# Patient Record
Sex: Female | Born: 1965 | Race: White | Hispanic: Yes | Marital: Married | Smoking: Never smoker
Health system: Southern US, Community
[De-identification: ages and names within clinical notes are randomized; demographics above are authoritative.]

## PROBLEM LIST (undated history)

## (undated) DIAGNOSIS — E785 Hyperlipidemia, unspecified: Secondary | ICD-10-CM

---

## 2006-11-15 ENCOUNTER — Encounter: Admission: RE | Admit: 2006-11-15 | Discharge: 2006-11-15 | Payer: Self-pay | Admitting: Sports Medicine

## 2006-11-15 IMAGING — MG MM SCREEN MAMMOGRAM BILATERAL
5 series · 5 of 5 positions shown · non-contrast
Comparison: none

DG SCREEN MAMMOGRAM BILATERAL
Bilateral CC and MLO view(s) were taken.

DIGITAL SCREENING MAMMOGRAM WITH CAD:
There is a  dense fibroglandular pattern.  No masses or malignant type calcifications are 
identified.

[R CC (1 of 2)]
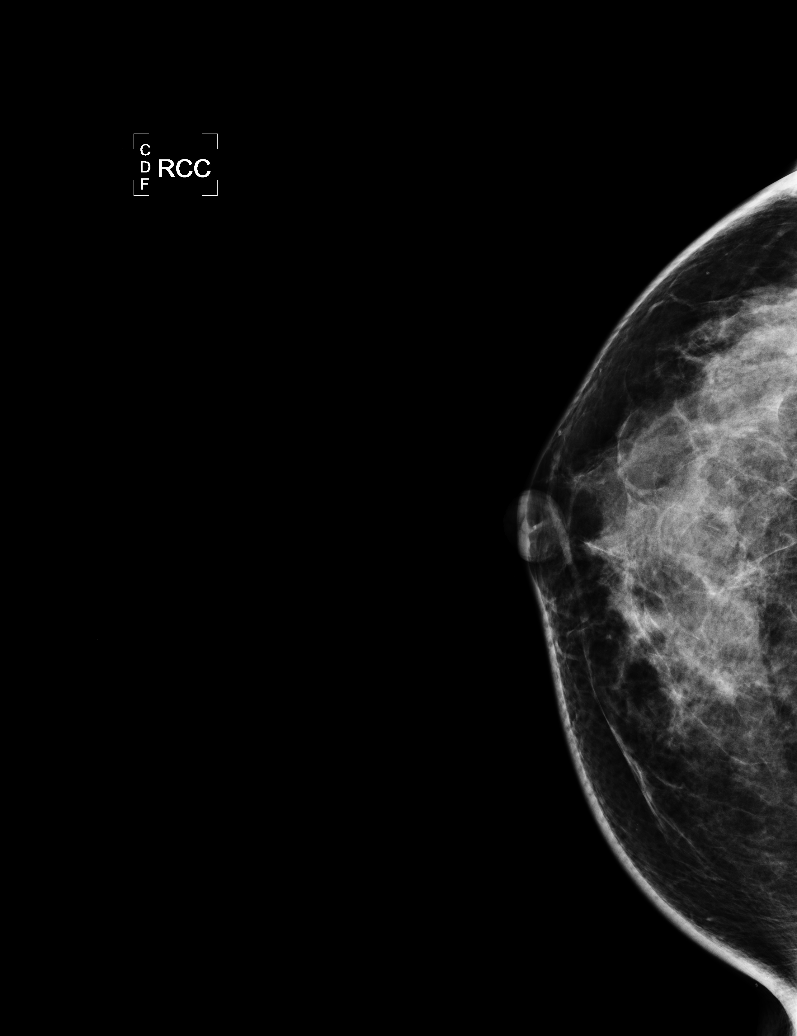

[L CC]
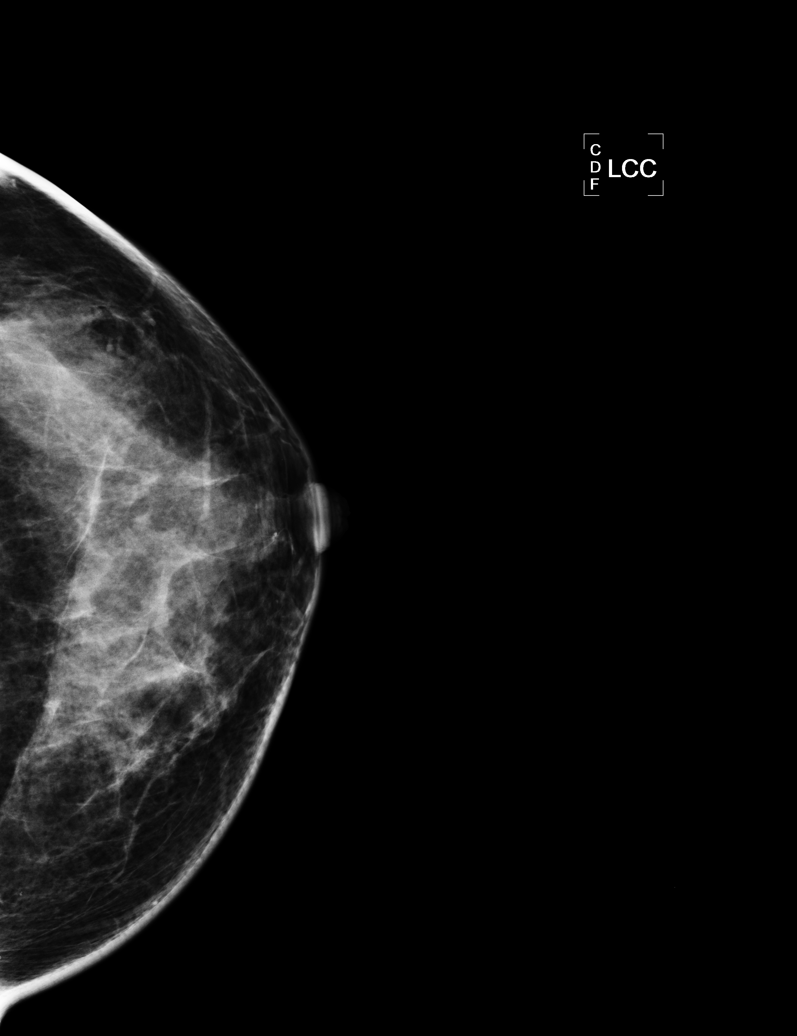

[L MLO]
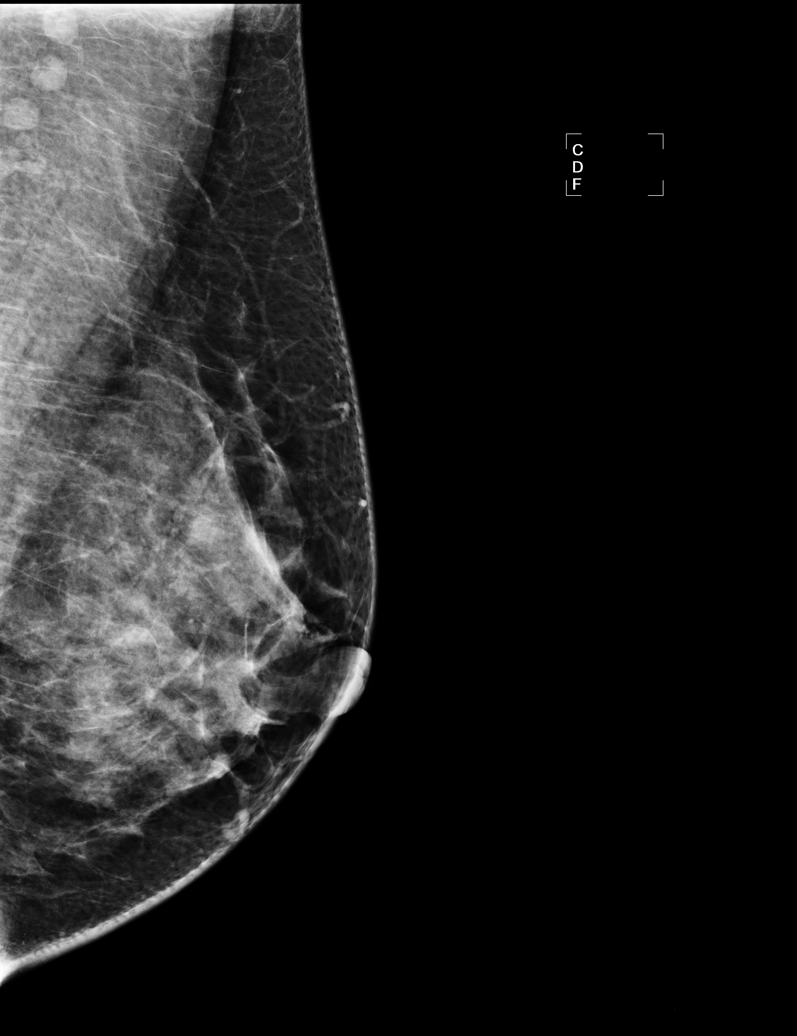

[R MLO]
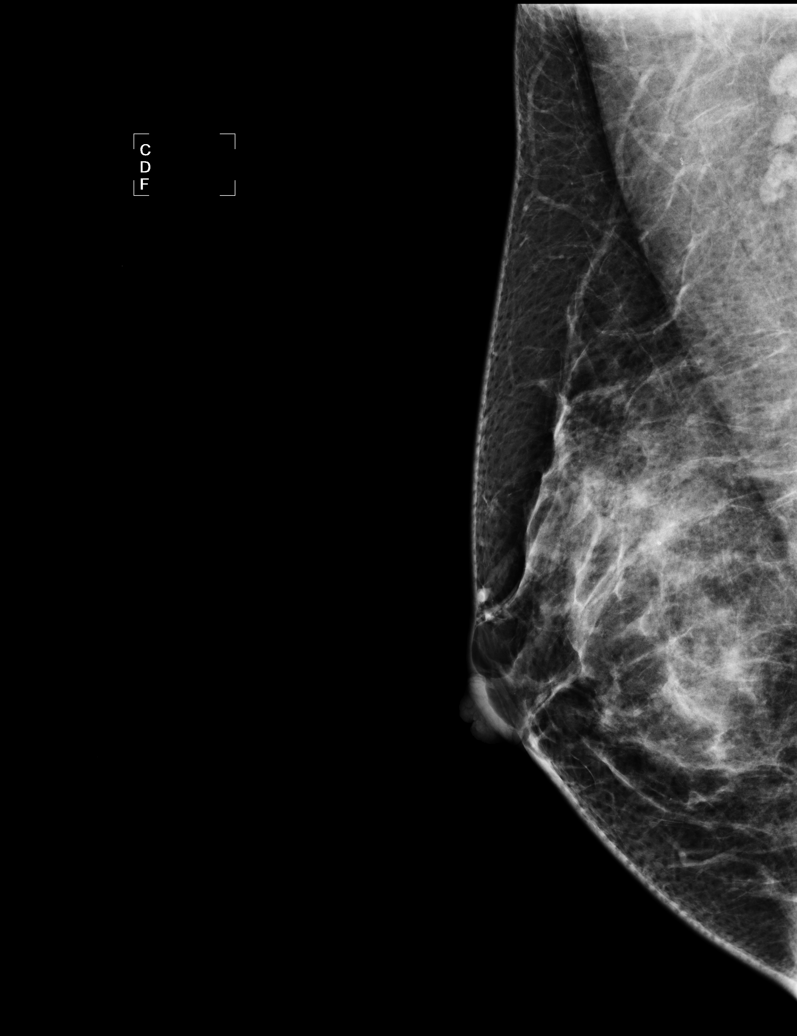

[R CC (2 of 2)]
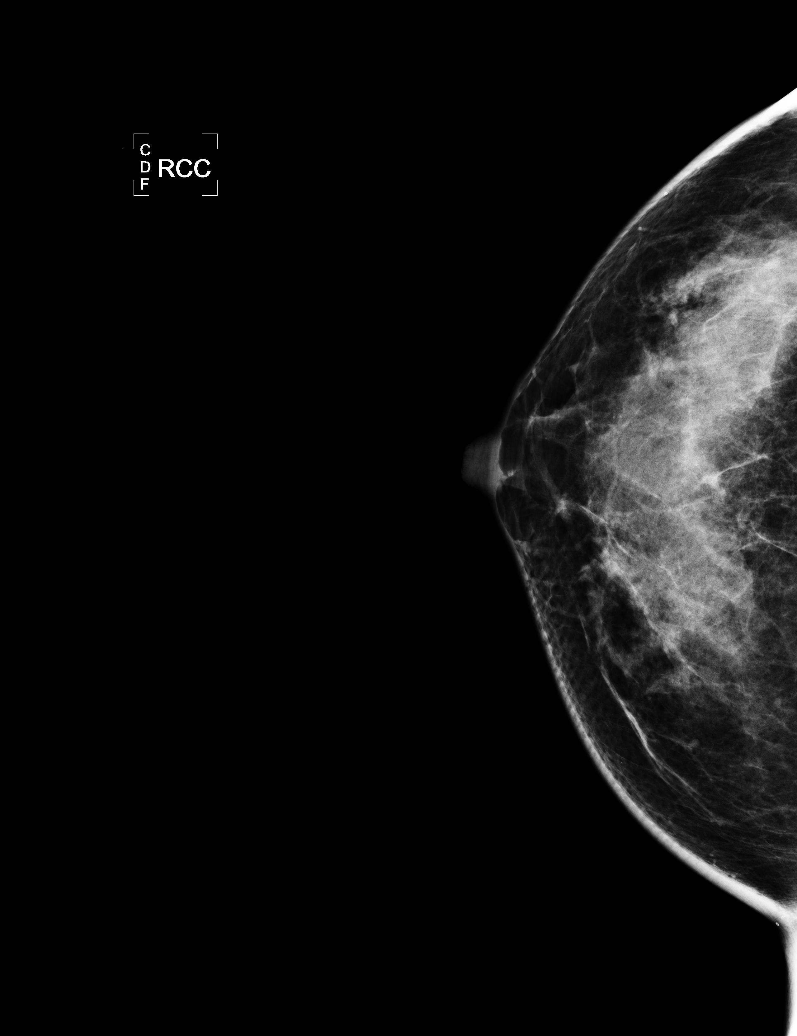

[5 of 5 positions shown; findings below may reference images not displayed]

IMPRESSION: No specific mammographic evidence of malignancy.  Next screening mammogram is recommended in one 
year.

ASSESSMENT: Negative - BI-RADS 1

Screening mammogram in 1 year.
ANALYZED BY COMPUTER AIDED DETECTION. , THIS PROCEDURE WAS A DIGITAL MAMMOGRAM.

## 2009-11-25 ENCOUNTER — Encounter: Admission: RE | Admit: 2009-11-25 | Discharge: 2009-11-25 | Payer: Self-pay | Admitting: Family Medicine

## 2013-10-18 ENCOUNTER — Encounter (HOSPITAL_COMMUNITY): Payer: Self-pay | Admitting: Pharmacist

## 2013-10-24 NOTE — H&P (Addendum)
48 yo with irregular VB and endometrial mass presents for surgical mngt  PMHx:  Neg PSHx:  SVD x 2, c-section x 1 All:  None Meds:  MTV SHx:  Neg tobacco  Af, vss Gen - NAD ABd - soft, NT CV - RRR Lungs - clear Ext - NT PV - uterus mobile, NT.  No adnexal mass  US:  2.1cm intracavitary mass noted after saline infusion.  Intramural fibroids No adnexal mass  A/P:  Irregular vb and endometrial mass - suspect polyp or fibroid Hysteroscopy, D&C, resection of mass R/b/a discussed, questions answered, informed consent

## 2013-10-27 MED ORDER — CEFOTETAN DISODIUM 2 G IJ SOLR
2.0000 g | INTRAMUSCULAR | Status: AC
  Administered 2013-10-28: 2 g via INTRAVENOUS
  Filled 2013-10-27: qty 2

## 2013-10-28 ENCOUNTER — Ambulatory Visit (HOSPITAL_COMMUNITY)
Admission: RE | Admit: 2013-10-28 | Discharge: 2013-10-28 | Disposition: A | Discharge status: Home / Self Care | Payer: BC Managed Care – PPO | Source: Ambulatory Visit | Attending: Obstetrics and Gynecology | Admitting: Obstetrics and Gynecology

## 2013-10-28 ENCOUNTER — Encounter (HOSPITAL_COMMUNITY): Payer: BC Managed Care – PPO | Admitting: Anesthesiology

## 2013-10-28 ENCOUNTER — Ambulatory Visit (HOSPITAL_COMMUNITY): Payer: BC Managed Care – PPO | Admitting: Anesthesiology

## 2013-10-28 ENCOUNTER — Encounter (HOSPITAL_COMMUNITY)
Admission: RE | Disposition: A | Discharge status: Home / Self Care | Payer: Self-pay | Source: Ambulatory Visit | Attending: Obstetrics and Gynecology

## 2013-10-28 DIAGNOSIS — N840 Polyp of corpus uteri: Secondary | ICD-10-CM | POA: Insufficient documentation

## 2013-10-28 HISTORY — PX: DILATATION & CURETTAGE/HYSTEROSCOPY WITH TRUECLEAR: SHX6353

## 2013-10-28 LAB — CBC
HCT: 34.2 % — ABNORMAL LOW (ref 36.0–46.0)
Hemoglobin: 11.3 g/dL — ABNORMAL LOW (ref 12.0–15.0)
MCH: 28.1 pg (ref 26.0–34.0)
MCHC: 33 g/dL (ref 30.0–36.0)
MCV: 85.1 fL (ref 78.0–100.0)
PLATELETS: 294 10*3/uL (ref 150–400)
RBC: 4.02 MIL/uL (ref 3.87–5.11)
RDW: 13.9 % (ref 11.5–15.5)
WBC: 6.6 10*3/uL (ref 4.0–10.5)

## 2013-10-28 LAB — PREGNANCY, URINE: Preg Test, Ur: NEGATIVE

## 2013-10-28 SURGERY — DILATATION & CURETTAGE/HYSTEROSCOPY WITH TRUCLEAR
Anesthesia: General | Site: Uterus

## 2013-10-28 MED ORDER — DEXAMETHASONE SODIUM PHOSPHATE 4 MG/ML IJ SOLN
INTRAMUSCULAR | Status: DC | PRN
  Administered 2013-10-28: 10 mg via INTRAVENOUS

## 2013-10-28 MED ORDER — DEXAMETHASONE SODIUM PHOSPHATE 10 MG/ML IJ SOLN
INTRAMUSCULAR | Status: AC
  Filled 2013-10-28: qty 1

## 2013-10-28 MED ORDER — KETOROLAC TROMETHAMINE 30 MG/ML IJ SOLN
INTRAMUSCULAR | Status: DC | PRN
  Administered 2013-10-28: 30 mg via INTRAVENOUS

## 2013-10-28 MED ORDER — HYDROCODONE-IBUPROFEN 7.5-200 MG PO TABS: 1.0000 | ORAL_TABLET | Freq: Three times a day (TID) | ORAL | Status: DC | PRN

## 2013-10-28 MED ORDER — LIDOCAINE HCL (CARDIAC) 20 MG/ML IV SOLN
INTRAVENOUS | Status: AC
  Filled 2013-10-28: qty 5

## 2013-10-28 MED ORDER — ONDANSETRON HCL 4 MG/2ML IJ SOLN
INTRAMUSCULAR | Status: DC | PRN
  Administered 2013-10-28: 4 mg via INTRAVENOUS

## 2013-10-28 MED ORDER — MIDAZOLAM HCL 2 MG/2ML IJ SOLN: 0.5000 mg | Freq: Once | INTRAMUSCULAR | Status: DC | PRN

## 2013-10-28 MED ORDER — LIDOCAINE HCL (CARDIAC) 20 MG/ML IV SOLN
INTRAVENOUS | Status: DC | PRN
  Administered 2013-10-28: 80 mg via INTRAVENOUS

## 2013-10-28 MED ORDER — MIDAZOLAM HCL 2 MG/2ML IJ SOLN
INTRAMUSCULAR | Status: AC
  Filled 2013-10-28: qty 2

## 2013-10-28 MED ORDER — MEPERIDINE HCL 25 MG/ML IJ SOLN: 6.2500 mg | INTRAMUSCULAR | Status: DC | PRN

## 2013-10-28 MED ORDER — LACTATED RINGERS IV SOLN
INTRAVENOUS | Status: DC
  Administered 2013-10-28 (×2): via INTRAVENOUS

## 2013-10-28 MED ORDER — LIDOCAINE HCL 1 % IJ SOLN
INTRAMUSCULAR | Status: AC
  Filled 2013-10-28: qty 20

## 2013-10-28 MED ORDER — MIDAZOLAM HCL 2 MG/2ML IJ SOLN
INTRAMUSCULAR | Status: DC | PRN
  Administered 2013-10-28: 2 mg via INTRAVENOUS

## 2013-10-28 MED ORDER — EPHEDRINE 5 MG/ML INJ
INTRAVENOUS | Status: AC
  Filled 2013-10-28: qty 10

## 2013-10-28 MED ORDER — PROPOFOL 10 MG/ML IV EMUL
INTRAVENOUS | Status: AC
  Filled 2013-10-28: qty 20

## 2013-10-28 MED ORDER — ONDANSETRON HCL 4 MG/2ML IJ SOLN
INTRAMUSCULAR | Status: AC
  Filled 2013-10-28: qty 2

## 2013-10-28 MED ORDER — EPHEDRINE SULFATE 50 MG/ML IJ SOLN
INTRAMUSCULAR | Status: DC | PRN
  Administered 2013-10-28 (×2): 5 mg via INTRAVENOUS

## 2013-10-28 MED ORDER — KETOROLAC TROMETHAMINE 30 MG/ML IJ SOLN: 15.0000 mg | Freq: Once | INTRAMUSCULAR | Status: DC | PRN

## 2013-10-28 MED ORDER — PROMETHAZINE HCL 25 MG/ML IJ SOLN: 6.2500 mg | INTRAMUSCULAR | Status: DC | PRN

## 2013-10-28 MED ORDER — FENTANYL CITRATE 0.05 MG/ML IJ SOLN
INTRAMUSCULAR | Status: DC | PRN
  Administered 2013-10-28: 100 ug via INTRAVENOUS

## 2013-10-28 MED ORDER — PROPOFOL INFUSION 10 MG/ML OPTIME
INTRAVENOUS | Status: DC | PRN
  Administered 2013-10-28: 150 mL via INTRAVENOUS

## 2013-10-28 MED ORDER — FENTANYL CITRATE 0.05 MG/ML IJ SOLN
INTRAMUSCULAR | Status: AC
  Filled 2013-10-28: qty 5

## 2013-10-28 MED ORDER — LIDOCAINE HCL 1 % IJ SOLN
INTRAMUSCULAR | Status: DC | PRN
  Administered 2013-10-28: 20 mL

## 2013-10-28 MED ORDER — FENTANYL CITRATE 0.05 MG/ML IJ SOLN: 25.0000 ug | INTRAMUSCULAR | Status: DC | PRN

## 2013-10-28 MED ORDER — SODIUM CHLORIDE 0.9 % IR SOLN
Status: DC | PRN
  Administered 2013-10-28: 3000 mL

## 2013-10-28 SURGICAL SUPPLY — 23 items
BLADE INCISOR TRUC PLUS 2.9 (ABLATOR) IMPLANT
CANISTERS HI-FLOW 3000CC (CANNISTER) ×2 IMPLANT
CATH ROBINSON RED A/P 16FR (CATHETERS) ×3 IMPLANT
CLOTH BEACON ORANGE TIMEOUT ST (SAFETY) ×3 IMPLANT
CONTAINER PREFILL 10% NBF 60ML (FORM) ×6 IMPLANT
DRAPE HYSTEROSCOPY (DRAPE) ×3 IMPLANT
DRSG TELFA 3X8 NADH (GAUZE/BANDAGES/DRESSINGS) ×3 IMPLANT
GLOVE BIO SURGEON STRL SZ 6.5 (GLOVE) ×2 IMPLANT
GLOVE BIO SURGEONS STRL SZ 6.5 (GLOVE) ×1
GLOVE BIOGEL PI IND STRL 7.0 (GLOVE) ×1 IMPLANT
GLOVE BIOGEL PI INDICATOR 7.0 (GLOVE) ×2
GOWN STRL REUS W/TWL LRG LVL3 (GOWN DISPOSABLE) ×6 IMPLANT
INCISOR TRUC PLUS BLADE 2.9 (ABLATOR) ×3
KIT HYSTEROSCOPY TRUCLEAR (ABLATOR) ×2 IMPLANT
MORCELLATOR RECIP TRUCLEAR 4.0 (ABLATOR) IMPLANT
NDL SPNL 22GX3.5 QUINCKE BK (NEEDLE) ×1 IMPLANT
NEEDLE SPNL 22GX3.5 QUINCKE BK (NEEDLE) ×3 IMPLANT
PACK VAGINAL MINOR WOMEN LF (CUSTOM PROCEDURE TRAY) ×3 IMPLANT
PAD DRESSING TELFA 3X8 NADH (GAUZE/BANDAGES/DRESSINGS) ×1 IMPLANT
PAD OB MATERNITY 4.3X12.25 (PERSONAL CARE ITEMS) ×3 IMPLANT
SYR CONTROL 10ML LL (SYRINGE) ×3 IMPLANT
TOWEL OR 17X24 6PK STRL BLUE (TOWEL DISPOSABLE) ×6 IMPLANT
WATER STERILE IRR 1000ML POUR (IV SOLUTION) ×3 IMPLANT

## 2013-10-28 NOTE — Anesthesia Postprocedure Evaluation (Signed)
  Anesthesia Post-op Note  Anesthesia Post Note  Patient: Kristen Wells  Procedure(s) Performed: Procedure(s) (LRB): DILATATION & CURETTAGE/HYSTEROSCOPY WITH TRUCLEAR (N/A)  Anesthesia type: General  Patient location: PACU  Post pain: Pain level controlled  Post assessment: Post-op Vital signs reviewed  Last Vitals:  Filed Vitals:   10/28/13 1400  BP: 109/68  Pulse: 60  Temp:   Resp: 14    Post vital signs: Reviewed  Level of consciousness: sedated  Complications: No apparent anesthesia complications

## 2013-10-28 NOTE — Anesthesia Procedure Notes (Signed)
Procedure Name: LMA Insertion Date/Time: 10/28/2013 1:18 PM Performed by: Graciela HusbandsFUSSELL, Darlyn Repsher O Pre-anesthesia Checklist: Suction available, Emergency Drugs available, Timeout performed, Patient identified and Patient being monitored Patient Re-evaluated:Patient Re-evaluated prior to inductionOxygen Delivery Method: Circle system utilized Preoxygenation: Pre-oxygenation with 100% oxygen Intubation Type: IV induction LMA: LMA inserted LMA Size: 4.0 Number of attempts: 1 Placement Confirmation: breath sounds checked- equal and bilateral and positive ETCO2 Tube secured with: Tape Dental Injury: Teeth and Oropharynx as per pre-operative assessment

## 2013-10-28 NOTE — Discharge Instructions (Signed)
[facility] office 2-3 weeks for postop appointment.  Call the office [phone] for an appointment. ° °Personal Hygiene: °Use pads not tampons x 1week °You may shower, no tub baths or pools for 2-3 weeks °Wipe from front to back when using restroom ° °Activity: °Do not drive or operate any equipment for 24 hrs.   °Do not rest in bed all day °Walking is encouraged °Walk up and down stairs slowly °You may return to your normal activity in 1-2 days ° °Sexual Activity:  No intercourse for 2 weeks after the procedure. ° °Diet: Eat a light meal as desired this evening.  You may resume your usual diet tomorrow. ° °Return to work:  You may resume your work activities after 1-2 days ° °What to expect:  Expect to have vaginal bleeding/discharge for 2-3 days and spotting for 10-14 days.  It is not unusual to have soreness for 1-2 weeks.  You may have a slight burning sensation when you urinate for the first few days.  You may start your menses in 2-6 weeks.  Mild cramps may continue for a couple of days.   ° °Call your doctor:   °Excessive bleeding, saturating a pad every hour °Inability to urinate 6 hours after discharge °Pain not relieved with pain medications °Fever of 100.4 or greater ° ° °Post Anesthesia Home Care Instructions ° °Activity: °Get plenty of rest for the remainder of the day. A responsible adult should stay with you for 24 hours following the procedure.  °For the next 24 hours, DO NOT: °-Drive a car °-Operate machinery °-Drink alcoholic beverages °-Take any medication unless instructed by your physician °-Make any legal decisions or sign important papers. ° °Meals: °Start with liquid foods such as gelatin or soup. Progress to regular foods as tolerated. Avoid greasy, spicy, heavy foods. If nausea and/or vomiting occur, drink only clear liquids until the nausea and/or vomiting subsides. Call your physician if vomiting continues. ° °Special Instructions/Symptoms: °Your throat may feel dry or sore from the anesthesia or  the breathing tube placed in your throat during surgery. If this causes discomfort, gargle with warm salt water. The discomfort should disappear within 24 hours. ° °

## 2013-10-28 NOTE — Anesthesia Preprocedure Evaluation (Addendum)
Anesthesia Evaluation  Patient identified by MRN, date of birth, ID band Patient awake    Reviewed: Allergy & Precautions, H&P , Patient's Chart, lab work & pertinent test results  Airway Mallampati: II      Dental   Pulmonary  breath sounds clear to auscultation        Cardiovascular Exercise Tolerance: Good Rhythm:regular Rate:Normal     Neuro/Psych negative psych ROS   GI/Hepatic   Endo/Other    Renal/GU      Musculoskeletal   Abdominal   Peds  Hematology   Anesthesia Other Findings   Reproductive/Obstetrics                           Anesthesia Physical Anesthesia Plan  ASA: I  Anesthesia Plan: General LMA   Post-op Pain Management:    Induction:   Airway Management Planned:   Additional Equipment:   Intra-op Plan:   Post-operative Plan:   Informed Consent: I have reviewed the patients History and Physical, chart, labs and discussed the procedure including the risks, benefits and alternatives for the proposed anesthesia with the patient or authorized representative who has indicated his/her understanding and acceptance.     Plan Discussed with: Anesthesiologist, CRNA and Surgeon  Anesthesia Plan Comments:        Anesthesia Quick Evaluation

## 2013-10-28 NOTE — Transfer of Care (Signed)
Immediate Anesthesia Transfer of Care Note  Patient: Kristen Wells  Procedure(s) Performed: Procedure(s): DILATATION & CURETTAGE/HYSTEROSCOPY WITH TRUCLEAR (N/A)  Patient Location: PACU  Anesthesia Type:General  Level of Consciousness: awake, alert  and oriented  Airway & Oxygen Therapy: Patient Spontanous Breathing and Patient connected to nasal cannula oxygen  Post-op Assessment: Report given to PACU RN and Post -op Vital signs reviewed and stable  Post vital signs: Reviewed and stable  Complications: No apparent anesthesia complications

## 2013-10-29 ENCOUNTER — Encounter (HOSPITAL_COMMUNITY): Payer: Self-pay | Admitting: Obstetrics and Gynecology

## 2013-10-29 NOTE — Op Note (Signed)
NAMDanise Mina:  Wells, Kristen Wells                  ACCOUNT NO.:  [id number]  MEDICAL RECORD NO.:  [id number]  LOCATION:  [facility]                          [facility]ACILITY:  [facility]  PHYSICIAN:  Zelphia CairoGretchen Yenty Bloch, MD    DATE OF BIRTH:  [birth year]  DATE OF PROCEDURE:  10/28/2013 DATE OF DISCHARGE:  10/28/2013                              OPERATIVE REPORT   PREOPERATIVE DIAGNOSES: 1. Endometrial polyp. 2. Endometrial mass.  POSTOPERATIVE DIAGNOSES: 1. Endometrial polyp. 2. Endometrial mass, path pending.  PROCEDURE: 1. Cervical block. 2. Hysteroscopy. 3. D and C. 4. Polypectomy.  SURGEON:  Zelphia CairoGretchen Lavel Rieman, MD  ANESTHESIA:  General.  SPECIMEN: 1. Endometrial curettings. 2. Endometrial polyp.  CONDITION:  Stable.  COMPLICATIONS:  None.  PROCEDURE:  The patient was taken to the operating room and after informed consent was obtained, anesthesia was found to be adequate and she was placed in the dorsal lithotomy position using Allen stirrups. She was prepped and draped in sterile fashion and an in and out catheter was used to drain her bladder.  Bivalve speculum was placed in the vagina and 1 mL of 1% lidocaine was injected at the 12 o'clock of the cervix.  Single-tooth tenaculum was attached to the anterior lip of the cervix.  The remaining 9 mL of lidocaine was used to perform a cervical block.  The cervix was then serially dilated using Pratt dilators. Hysteroscope was inserted.  Endometrial polyp was identified in the right cornual region and no other masses or abnormalities were seen. True clear blade was inserted through the scope and the polyp was resected to the base.  A gentle curetting was then performed throughout the endometrial cavity.  Specimen was placed on Telfa and passed off to be sent to Pathology.  The patient tolerated the procedure well.  All instruments were removed from the uterus. Tenaculum was removed from the cervix.  The cervix was hemostatic. Speculum was removed.  She was  extubated and taken to the recovery room in stable condition.  Sponge, lap, needle, and instrument counts were correct x2.     Zelphia CairoGretchen Velicia Dejager, MD     GA/MEDQ  D:  10/28/2013  T:  10/29/2013  Job:  [id number]

## 2014-12-16 ENCOUNTER — Other Ambulatory Visit: Payer: Self-pay | Admitting: Obstetrics and Gynecology

## 2014-12-17 LAB — CYTOLOGY - PAP

## 2017-12-26 ENCOUNTER — Encounter: Payer: Self-pay | Admitting: Obstetrics and Gynecology

## 2019-08-10 ENCOUNTER — Ambulatory Visit: Payer: BC Managed Care – PPO | Attending: Internal Medicine

## 2019-08-10 DIAGNOSIS — Z23 Encounter for immunization: Secondary | ICD-10-CM | POA: Insufficient documentation

## 2019-08-10 NOTE — Progress Notes (Signed)
   Covid-19 Vaccination Clinic  Name:  Toriana Sponsel    MRN: [id number] DOB: 05-10-1966  08/10/2019  Ms. Crawshaw was observed post Covid-19 immunization for 15 minutes without incidence. She was provided with Vaccine Information Sheet and instruction to access the V-Safe system.   Ms. Herrmann was instructed to call 911 with any severe reactions post vaccine: Marland Kitchen Difficulty breathing  . Swelling of your face and throat  . A fast heartbeat  . A bad rash all over your body  . Dizziness and weakness    Immunizations Administered    Name Date Dose VIS Date Route   Pfizer COVID-19 Vaccine 08/10/2019 11:22 AM 0.3 mL 05/24/2019 Intramuscular   Manufacturer: [employer], [employer]   Lot: [id number]   NDC: [address]-3

## 2019-09-04 ENCOUNTER — Ambulatory Visit: Payer: BC Managed Care – PPO | Attending: Internal Medicine

## 2019-09-04 ENCOUNTER — Ambulatory Visit: Payer: BC Managed Care – PPO

## 2019-09-04 DIAGNOSIS — Z23 Encounter for immunization: Secondary | ICD-10-CM

## 2019-09-04 NOTE — Progress Notes (Signed)
   Covid-19 Vaccination Clinic  Name:  Naika Noto    MRN: [id number] DOB: 1965-07-16  09/04/2019  Ms. Delahunt was observed post Covid-19 immunization for 15 minutes without incident. She was provided with Vaccine Information Sheet and instruction to access the V-Safe system.   Ms. Erck was instructed to call 911 with any severe reactions post vaccine: Marland Kitchen Difficulty breathing  . Swelling of face and throat  . A fast heartbeat  . A bad rash all over body  . Dizziness and weakness   Immunizations Administered    Name Date Dose VIS Date Route   Pfizer COVID-19 Vaccine 09/04/2019  8:17 AM 0.3 mL 05/24/2019 Intramuscular   Manufacturer: [employer], [employer]   Lot: [id number]   NDC: [address]-4

## 2021-03-20 DIAGNOSIS — Z23 Encounter for immunization: Secondary | ICD-10-CM | POA: Diagnosis not present

## 2024-04-02 ENCOUNTER — Emergency Department (HOSPITAL_COMMUNITY)

## 2024-04-02 ENCOUNTER — Emergency Department (HOSPITAL_COMMUNITY)
Admission: EM | Admit: 2024-04-02 | Discharge: 2024-04-02 | Disposition: A | Discharge status: Home / Self Care | Attending: Emergency Medicine | Admitting: Emergency Medicine

## 2024-04-02 ENCOUNTER — Encounter (HOSPITAL_COMMUNITY): Payer: Self-pay | Admitting: Emergency Medicine

## 2024-04-02 ENCOUNTER — Other Ambulatory Visit: Payer: Self-pay

## 2024-04-02 DIAGNOSIS — K8020 Calculus of gallbladder without cholecystitis without obstruction: Secondary | ICD-10-CM | POA: Diagnosis not present

## 2024-04-02 DIAGNOSIS — R7989 Other specified abnormal findings of blood chemistry: Secondary | ICD-10-CM | POA: Diagnosis not present

## 2024-04-02 DIAGNOSIS — R1013 Epigastric pain: Secondary | ICD-10-CM | POA: Diagnosis present

## 2024-04-02 LAB — COMPREHENSIVE METABOLIC PANEL WITH GFR
ALT: 29 U/L (ref 0–44)
AST: 24 U/L (ref 15–41)
Albumin: 3.9 g/dL (ref 3.5–5.0)
Alkaline Phosphatase: 102 U/L (ref 38–126)
Anion gap: 8 (ref 5–15)
BUN: 10 mg/dL (ref 6–20)
CO2: 25 mmol/L (ref 22–32)
Calcium: 9.2 mg/dL (ref 8.9–10.3)
Chloride: 101 mmol/L (ref 98–111)
Creatinine, Ser: 0.64 mg/dL (ref 0.44–1.00)
GFR, Estimated: >60 mL/min (ref >60)
Glucose, Bld: 120 mg/dL — ABNORMAL HIGH (ref 70–99)
Potassium: 3.5 mmol/L (ref 3.5–5.1)
Sodium: 134 mmol/L — ABNORMAL LOW (ref 135–145)
Total Bilirubin: 0.8 mg/dL (ref 0.0–1.2)
Total Protein: 7.7 g/dL (ref 6.5–8.1)

## 2024-04-02 LAB — CBC
HCT: 38.5 % (ref 36.0–46.0)
Hemoglobin: 13.2 g/dL (ref 12.0–15.0)
MCH: 28.9 pg (ref 26.0–34.0)
MCHC: 34.3 g/dL (ref 30.0–36.0)
MCV: 84.4 fL (ref 80.0–100.0)
Platelets: 341 K/uL (ref 150–400)
RBC: 4.56 MIL/uL (ref 3.87–5.11)
RDW: 12.4 % (ref 11.5–15.5)
WBC: 10.7 K/uL — ABNORMAL HIGH (ref 4.0–10.5)
nRBC: 0 % (ref 0.0–0.2)

## 2024-04-02 LAB — URINALYSIS, ROUTINE W REFLEX MICROSCOPIC
Bilirubin Urine: NEGATIVE
Glucose, UA: NEGATIVE mg/dL
Hgb urine dipstick: NEGATIVE
Ketones, ur: NEGATIVE mg/dL
Leukocytes,Ua: NEGATIVE
Nitrite: NEGATIVE
Protein, ur: 30 mg/dL — AB
Specific Gravity, Urine: 1.024 (ref 1.005–1.030)
pH: 7 (ref 5.0–8.0)

## 2024-04-02 LAB — TROPONIN I (HIGH SENSITIVITY)
Troponin I (High Sensitivity): 3 ng/L (ref <18)
Troponin I (High Sensitivity): 4 ng/L (ref <18)

## 2024-04-02 LAB — D-DIMER, QUANTITATIVE: D-Dimer, Quant: 0.81 ug{FEU}/mL — ABNORMAL HIGH (ref 0.00–0.50)

## 2024-04-02 LAB — LIPASE, BLOOD: Lipase: 27 U/L (ref 11–51)

## 2024-04-02 MED ORDER — MORPHINE SULFATE 15 MG PO TABS: 7.5000 mg | ORAL_TABLET | ORAL | 0 refills | Status: AC | PRN

## 2024-04-02 MED ORDER — FAMOTIDINE IN NACL 20-0.9 MG/50ML-% IV SOLN
20.0000 mg | Freq: Once | INTRAVENOUS | Status: AC
  Administered 2024-04-02: 20 mg via INTRAVENOUS
  Filled 2024-04-02: qty 50

## 2024-04-02 MED ORDER — ONDANSETRON 4 MG PO TBDP
4.0000 mg | ORAL_TABLET | Freq: Once | ORAL | Status: AC
  Administered 2024-04-02: 4 mg via ORAL
  Filled 2024-04-02: qty 1

## 2024-04-02 MED ORDER — OXYCODONE-ACETAMINOPHEN 5-325 MG PO TABS
1.0000 | ORAL_TABLET | ORAL | Status: DC | PRN
  Administered 2024-04-02: 1 via ORAL
  Filled 2024-04-02: qty 1

## 2024-04-02 MED ORDER — ASPIRIN 325 MG PO TBEC
325.0000 mg | DELAYED_RELEASE_TABLET | Freq: Once | ORAL | Status: AC
  Administered 2024-04-02: 325 mg via ORAL
  Filled 2024-04-02: qty 1

## 2024-04-02 MED ORDER — ALUM & MAG HYDROXIDE-SIMETH 200-200-20 MG/5ML PO SUSP
30.0000 mL | Freq: Once | ORAL | Status: AC
  Administered 2024-04-02: 30 mL via ORAL
  Filled 2024-04-02: qty 30

## 2024-04-02 MED ORDER — MORPHINE SULFATE (PF) 4 MG/ML IV SOLN
4.0000 mg | Freq: Once | INTRAVENOUS | Status: AC
  Administered 2024-04-02: 4 mg via INTRAVENOUS
  Filled 2024-04-02: qty 1

## 2024-04-02 MED ORDER — IOHEXOL 350 MG/ML SOLN
75.0000 mL | Freq: Once | INTRAVENOUS | Status: AC | PRN
  Administered 2024-04-02: 75 mL via INTRAVENOUS

## 2024-04-02 MED ORDER — ONDANSETRON 4 MG PO TBDP: ORAL_TABLET | ORAL | 0 refills | Status: AC

## 2024-04-02 MED ORDER — NITROGLYCERIN 0.4 MG SL SUBL: 0.4000 mg | SUBLINGUAL_TABLET | SUBLINGUAL | Status: DC | PRN

## 2024-04-02 NOTE — ED Provider Notes (Signed)
 Received patient in turnover from Dr. Jerrol.  Please see their note for further details of Hx, PE.  Briefly patient is a 58 y.o. female with a Abdominal Pain, Emesis, and Nausea .  Ddimer + plan for CTA.   CT angiogram of the chest is negative for PE.  Radiology read concerning for enlarged gallbladder.  No obvious stones.  I discussed results with patient.  She points to more epigastric discomfort.  Described as burning.  No obvious focal right upper quadrant pain.  Discussed risk and benefits of ultrasound she like to have it obtained today.  Her LFTs and lipase were unremarkable.  Right upper quadrant ultrasound with.  Cholecystic fluid no obvious wall thickening common bile duct nondilated no sonographic Murphy sign.  Patient is feeling better on repeat assessment.  I discussed further options with patient and family.  At this time they would like to try and go home.  Will follow-up with general surgery in clinic.  Short course of pain medicine.   Emil Share, DO 04/02/24 2032

## 2024-04-02 NOTE — ED Notes (Signed)
 Patient transported to CT

## 2024-04-02 NOTE — Discharge Instructions (Signed)
 Try pepcid or tagamet up to twice a day.  Try to avoid things that may make this worse, most commonly these are spicy foods tomato based products fatty foods chocolate and peppermint.  Alcohol and tobacco can also make this worse.  Return to the emergency department for sudden worsening pain fever or inability to eat or drink.  Please call the general surgery office tomorrow morning and try to see them in clinic.

## 2024-04-02 NOTE — ED Provider Notes (Addendum)
 [place] EMERGENCY DEPARTMENT AT [facility] Provider Note   CSN: [id number] Arrival date & time: 04/02/24  9253     Patient presents with: Abdominal Pain, Emesis, and Nausea   Kristen Wells is a 58 y.o. female.    Abdominal Pain Associated symptoms: chest pain and vomiting   Emesis Associated symptoms: abdominal pain      58 year old female presenting to the emergency department with a chief complaint of epigastric and substernal chest pain.  The patient states that she has had pain since Sunday.  Was initially mild but intensified after dinner becoming constant and unbearable.  She has been having difficulty keeping things down.  Attempts to alleviate her symptoms with over-the-counter medications were unsuccessful.  She endorses a pressure sensation substernally, some focality in the epigastrium. She presents to the emergency department for cardiac workup, states that she is worried about her heart.  Prior to Admission medications   Medication Sig Start Date End Date Taking? Authorizing Provider  morphine (MSIR) 15 MG tablet Take 0.5 tablets (7.5 mg total) by mouth every 4 (four) hours as needed. 04/02/24  Yes Emil Share, DO  ondansetron  (ZOFRAN -ODT) 4 MG disintegrating tablet 4mg  ODT q4 hours prn nausea/vomit 04/02/24  Yes Floyd, Dan, DO  CALCIUM-MAGNESIUM-ZINC PO Take 1 tablet by mouth daily.    [provider]  ferrous sulfate 325 (65 FE) MG tablet Take 325 mg by mouth every other day.    [provider]  HYDROcodone -ibuprofen  (VICOPROFEN ) 7.5-200 MG per tablet Take 1 tablet by mouth every 8 (eight) hours as needed for moderate pain. 10/28/13   Latisha Medford, MD  Maca Root (MACA PO) Take by mouth daily.    [provider]    Allergies: Patient has no known allergies.    Review of Systems  Cardiovascular:  Positive for chest pain.  Gastrointestinal:  Positive for abdominal pain and vomiting.    Updated Vital Signs BP 131/65   Pulse  73   Temp 98.4 F (36.9 C)   Resp 18   Ht 5' 7 (1.702 m)   Wt 77.1 kg   SpO2 98%   BMI 26.63 kg/m   Physical Exam Vitals and nursing note reviewed.  Constitutional:      General: She is not in acute distress.    Appearance: She is well-developed.  HENT:     Head: Normocephalic and atraumatic.  Eyes:     Conjunctiva/sclera: Conjunctivae normal.  Cardiovascular:     Rate and Rhythm: Normal rate and regular rhythm.     Heart sounds: No murmur heard. Pulmonary:     Effort: Pulmonary effort is normal. No respiratory distress.     Breath sounds: Normal breath sounds.  Abdominal:     Palpations: Abdomen is soft.     Tenderness: There is abdominal tenderness in the epigastric area.     Comments: Initially mild epigastric tenderness however on further examination the patient points to her sternum and states it is more up here.  No right upper quadrant tenderness, negative Murphy sign  Musculoskeletal:        General: No swelling.     Cervical back: Neck supple.  Skin:    General: Skin is warm and dry.     Capillary Refill: Capillary refill takes less than 2 seconds.  Neurological:     Mental Status: She is alert.  Psychiatric:        Mood and Affect: Mood normal.     (all labs ordered are listed,  but only abnormal results are displayed) Labs Reviewed  COMPREHENSIVE METABOLIC PANEL WITH GFR - Abnormal; Notable for the following components:      Result Value   Sodium 134 (*)    Glucose, Bld 120 (*)    All other components within normal limits  CBC - Abnormal; Notable for the following components:   WBC 10.7 (*)    All other components within normal limits  URINALYSIS, ROUTINE W REFLEX MICROSCOPIC - Abnormal; Notable for the following components:   APPearance HAZY (*)    Protein, ur 30 (*)    Bacteria, UA RARE (*)    All other components within normal limits  D-DIMER, QUANTITATIVE - Abnormal; Notable for the following components:   D-Dimer, Quant 0.81 (*)    All other  components within normal limits  LIPASE, BLOOD  TROPONIN I (HIGH SENSITIVITY)  TROPONIN I (HIGH SENSITIVITY)    EKG: EKG Interpretation Date/Time:  Tuesday April 02 2024 07:52:40 EDT Ventricular Rate:  64 PR Interval:  172 QRS Duration:  92 QT Interval:  424 QTC Calculation: 437 R Axis:   79  Text Interpretation: Normal sinus rhythm Normal ECG No previous ECGs available Confirmed by Jerrol Agent ([id number]) on 04/02/2024 2:55:43 PM  Radiology: US  Abdomen Limited RUQ (LIVER/GB) Result Date: 04/02/2024 CLINICAL DATA:  Right upper quadrant pain. EXAM: ULTRASOUND ABDOMEN LIMITED RIGHT UPPER QUADRANT COMPARISON:  None Available. FINDINGS: Gallbladder: A 2.1 cm gallstone is seen within the gallbladder neck. There is no evidence of gallbladder wall thickening (2.6 mm). A mild amount of pericholecystic fluid is noted. No sonographic Murphy sign noted by sonographer. Common bile duct: Diameter: 2.8 mm Liver: No focal lesion identified. Diffusely increased echogenicity of the liver parenchyma is noted. Portal vein is patent on color Doppler imaging with normal direction of blood flow towards the liver. Other: None. IMPRESSION: 1. Cholelithiasis, as described above, and pericholecystic fluid without additional evidence to suggest the presence of acute cholecystitis. 2. Hepatic steatosis. Electronically Signed   By: Suzen Dials M.D.   On: 04/02/2024 19:12   CT Angio Chest PE W and/or Wo Contrast Result Date: 04/02/2024 CLINICAL DATA:  Pulmonary embolism (PE) suspected, low to intermediate prob, positive D-dimer, epigastric abdominal pain and vomiting since yesterday EXAM: CT ANGIOGRAPHY CHEST WITH CONTRAST TECHNIQUE: Multidetector CT imaging of the chest was performed using the standard protocol during bolus administration of intravenous contrast. Multiplanar CT image reconstructions and MIPs were obtained to evaluate the vascular anatomy. RADIATION DOSE REDUCTION: This exam was performed according  to the departmental dose-optimization program which includes automated exposure control, adjustment of the mA and/or kV according to patient size and/or use of iterative reconstruction technique. CONTRAST:  75mL OMNIPAQUE IOHEXOL 350 MG/ML SOLN COMPARISON:  04/02/2024 FINDINGS: Cardiovascular: This is a technically adequate evaluation of the pulmonary vasculature. No filling defects or pulmonary emboli. The heart is unremarkable without pericardial effusion. No evidence of thoracic aortic aneurysm or dissection. Mediastinum/Nodes: No enlarged mediastinal, hilar, or axillary lymph nodes. Thyroid gland, trachea, and esophagus demonstrate no significant findings. Lungs/Pleura: No acute airspace disease, effusion, or pneumothorax. Central airways are patent. Upper Abdomen: Partial visualization of a distended gallbladder. If gallbladder pathology is suspected, right upper quadrant ultrasound may be useful. No other acute upper abdominal findings. Musculoskeletal: No acute or destructive bony abnormalities. Reconstructed images demonstrate no additional findings. Review of the MIP images confirms the above findings. IMPRESSION: 1. No evidence of pulmonary embolus. 2. No acute intrathoracic process. 3. Partial visualization of a distended gallbladder. If gallbladder  pathology is suspected, right upper quadrant ultrasound may be useful. Electronically Signed   By: Ozell Daring M.D.   On: 04/02/2024 16:59   DG Chest 1 View Result Date: 04/02/2024 CLINICAL DATA:  Chest pressure EXAM: CHEST  1 VIEW COMPARISON:  None Available. FINDINGS: The heart size and mediastinal contours are within normal limits. Both lungs are clear. The visualized skeletal structures are unremarkable. IMPRESSION: No active disease. Electronically Signed   By: Luke Bun M.D.   On: 04/02/2024 15:25     Procedures   Medications Ordered in the ED  oxyCODONE-acetaminophen (PERCOCET/ROXICET) 5-325 MG per tablet 1 tablet (1 tablet Oral Given  04/02/24 1024)  nitroGLYCERIN (NITROSTAT) SL tablet 0.4 mg (has no administration in time range)  ondansetron  (ZOFRAN -ODT) disintegrating tablet 4 mg (4 mg Oral Given 04/02/24 0825)  aspirin EC tablet 325 mg (325 mg Oral Given 04/02/24 1519)  morphine (PF) 4 MG/ML injection 4 mg (4 mg Intravenous Given 04/02/24 1519)  alum & mag hydroxide-simeth (MAALOX/MYLANTA) 200-200-20 MG/5ML suspension 30 mL (30 mLs Oral Given 04/02/24 1653)  iohexol (OMNIPAQUE) 350 MG/ML injection 75 mL (75 mLs Intravenous Contrast Given 04/02/24 1644)  morphine (PF) 4 MG/ML injection 4 mg (4 mg Intravenous Given 04/02/24 1909)  famotidine (PEPCID) IVPB 20 mg premix (20 mg Intravenous New Bag/Given 04/02/24 1910)                                    Medical Decision Making Amount and/or Complexity of Data Reviewed Labs: ordered. Radiology: ordered.  Risk OTC drugs. Prescription drug management.    58 year old female presenting to the emergency department with a chief complaint of epigastric and substernal chest pain.  The patient states that she has had pain since Sunday.  Was initially mild but intensified after dinner becoming constant and unbearable.  She has been having difficulty keeping things down.  Attempts to alleviate her symptoms with over-the-counter medications were unsuccessful.  She endorses a pressure sensation substernally, some focality in the epigastrium. She presents to the emergency department for cardiac workup, states that she is worried about her heart.  On arrival, the patient was afebrile, not tachycardic or tachypneic, hemodynamically stable, saturating well on room air.  On exam the patient had initially mild epigastric tenderness to palpation.  The patient then states that her pain is not in the epigastrium but more located in the substernal area.  She has no tenderness of her chest wall on exam.  No rash. She has been having nausea and vomiting.  Differential diagnosis is broad and includes  ACS, PE, GERD, gastritis, pancreatitis, gallstone disease.  Patient with a negative Murphy sign on exam, no right upper quadrant tenderness.  Less likely bowel obstruction.  My primary suspicion is for GERD or gastritis.   Initial EKG revealed sinus rhythm, ventricular rate 64, no acute ischemic changes.  Chest x-ray revealed no active disease.  Labs: Lipase normal, CMP generally unremarkable, CBC with a nonspecific leukocytosis 10.7, cardiac troponin 3, repeat cardiac troponin 4.  Urinalysis negative for UTI.  A D-dimer was elevated at 0.81.  Due to this, CT PE study was obtained as the patient was insisted that her pain was more in the substernal region and not in her abdomen.  CTA PE study pending at time of signout.  Plan at time of signout to follow-up results of CT imaging, reassess the patient following interventions of morphine, aspirin, Zofran .  Signout  given to Dr. Emil at [phone].       Final diagnoses:  Calculus of gallbladder without cholecystitis without obstruction    ED Discharge Orders          Ordered    morphine (MSIR) 15 MG tablet  Every 4 hours PRN        04/02/24 2010    ondansetron  (ZOFRAN -ODT) 4 MG disintegrating tablet        04/02/24 2010               Jerrol Agent, MD 04/02/24 2019    Jerrol Agent, MD 04/02/24 2020

## 2024-04-02 NOTE — Progress Notes (Signed)
 Subjective:   Patient Active Problem List   Diagnosis Date Noted  . Iron deficiency anemia, unspecified 01/05/2011  . Mixed hyperlipidemia 01/05/2011  . Pica 11/06/2009     Chief Complaint  Patient presents with  . Heartburn    Started on Sunday, and has worsened. Has tried Tums & Pepcid w/ no relief     History of Present Illness This is a 58 year old female with past medical history of hyperlipidemia presenting with heartburn.  She began experiencing heartburn on Sunday, 2 days ago, which was initially mild but intensified after dinner, becoming constant and unbearable. She has been unable to keep anything down, including water, since last night. Attempts to alleviate the symptoms with over-the-counter medications such as Pepcid and Tums have been unsuccessful. She also tried Tylenol, but it was vomited up. She reports nausea after consuming water or medication and has resorted to inducing vomiting to relieve the discomfort. She vomited her dinner last night. She reports no diarrhea or fever. The pain is localized to the center of her chest and does not radiate. She recalls one previous episode of heartburn that resolved spontaneously, but this current episode has persisted.   She has no other medical conditions and takes magnesium supplements regularly. She reports no history of high blood pressure, diabetes, kidney issues, heart attack, stroke, pancreatic problems, or gallstones. She still has her gallbladder. She reports probable high cholesterol. She does not smoke and consumes alcohol occasionally, approximately one drink per month.  She does not smoke currently but admits to smoking socially when she was 20. She drinks alcohol occasionally, about once a month.  She reports no family history of heart attack or stroke.  Parts of patient history reviewed include PMH, problem list, medications, allergies, and social history.  Objective:   Vitals:   04/02/24 0710  BP: 146/63   Pulse: 62  Resp: 18  Temp: 98.8 F (37.1 C)  TempSrc: Tympanic  SpO2: 99%  Weight: 77.6 kg (171 lb)    Physical Exam Vitals and nursing note reviewed.  Constitutional:      General: She is in acute distress (bending over in pain holding chest, restless).     Appearance: Normal appearance.  HENT:     Head: Normocephalic and atraumatic.     Mouth/Throat:     Mouth: Mucous membranes are moist.  Eyes:     Extraocular Movements: Extraocular movements intact.     Conjunctiva/sclera: Conjunctivae normal.     Pupils: Pupils are equal, round, and reactive to light.  Cardiovascular:     Rate and Rhythm: Normal rate and regular rhythm.     Pulses: Normal pulses.     Heart sounds: Normal heart sounds. No murmur heard. Pulmonary:     Effort: Pulmonary effort is normal. No respiratory distress.     Breath sounds: Normal breath sounds. No wheezing.  Abdominal:     General: Abdomen is flat. Bowel sounds are normal. There is no distension.     Palpations: Abdomen is soft.     Tenderness: There is no abdominal tenderness. There is no right CVA tenderness, left CVA tenderness, guarding or rebound. Negative signs include Murphy's sign.  Musculoskeletal:     Cervical back: Neck supple.  Skin:    General: Skin is warm.     Capillary Refill: Capillary refill takes less than 2 seconds.  Neurological:     General: No focal deficit present.     Mental Status: She is alert and oriented to person, place,  and time. Mental status is at baseline.    EKG- Sinus Bradycardia at 59 bpm, no active signs of ischemia  Assessment/Plan:   Assessment & Plan  Heartburn  - Aspirin 324 mg   Ddx: STEMI, NSTEMI, GERD, esophageal obstruction, cholelithiasis, cholecystitis, PUD, perforated ulcer  Patient presents with sternal chest pain worsening over the past 48 hours. Risk factors for ACS include hyperlipidemia. No prior dx of GERD.  EKG obtained upon triage does not show signs of active ischemia. Patient  appear uncomfortable, restless bent over holding chest in pain.  Abdomen is soft and nontender with negative Murphy sign, guarding, or rebound.  No history of pancreatitis.  Does not routinely drink alcohol.  Vomiting without nausea or diarrhea.  Due to urgent care limitations, cannot rule out acute coronary syndrome, patient made aware of this.  She was given 324 of chewable aspirin.  Offered EMS transport to closest emergency department for ACS rule out.  Patient elects to go POV with spouse.  Risks and benefits of private transfer discussed.  Patient verbalized understanding and discharged.    Disposition: - Transfer: Transfer to emergency department for further evaluation and management.   Follow up with ED   Patient agreed with plan and voiced understanding.  No barriers to adherence perceived by myself.  Portions of this note may have been dictated using Dragon dictation software/hardware and may contain grammatical or spelling errors.   Electronically signed by:   Sotero Pore, DNP ENP-C FNP-C [facility]  04/02/2024 7:33 AM

## 2024-04-02 NOTE — ED Triage Notes (Signed)
 Pt coming from UC with reports of epigastric abdominal pain and vomiting since last night. PT reports she was sent to the ER too make sure I wasn't having a heart attack.

## 2024-04-02 NOTE — ED Notes (Deleted)
 I Have IVC paper work  waiting on dr sign her name.

## 2024-04-02 NOTE — ED Triage Notes (Signed)
 Pt. Stated, Kristen Wells had a lot of upper stomach pain with N/V non stop since Sunday.

## 2024-04-04 ENCOUNTER — Observation Stay (HOSPITAL_COMMUNITY)
Admission: EM | Admit: 2024-04-04 | Discharge: 2024-04-05 | Disposition: A | Discharge status: Home / Self Care | Attending: Surgery | Admitting: Surgery

## 2024-04-04 ENCOUNTER — Encounter (HOSPITAL_COMMUNITY): Payer: Self-pay | Admitting: Radiology

## 2024-04-04 ENCOUNTER — Inpatient Hospital Stay (HOSPITAL_COMMUNITY)

## 2024-04-04 ENCOUNTER — Inpatient Hospital Stay (HOSPITAL_COMMUNITY): Admitting: Anesthesiology

## 2024-04-04 ENCOUNTER — Encounter (HOSPITAL_COMMUNITY): Admission: EM | Disposition: A | Payer: Self-pay | Source: Home / Self Care | Attending: Family Medicine

## 2024-04-04 ENCOUNTER — Other Ambulatory Visit: Payer: Self-pay

## 2024-04-04 DIAGNOSIS — K810 Acute cholecystitis: Secondary | ICD-10-CM | POA: Diagnosis present

## 2024-04-04 DIAGNOSIS — R109 Unspecified abdominal pain: Secondary | ICD-10-CM | POA: Diagnosis present

## 2024-04-04 DIAGNOSIS — K760 Fatty (change of) liver, not elsewhere classified: Secondary | ICD-10-CM | POA: Insufficient documentation

## 2024-04-04 DIAGNOSIS — K8000 Calculus of gallbladder with acute cholecystitis without obstruction: Principal | ICD-10-CM | POA: Insufficient documentation

## 2024-04-04 DIAGNOSIS — E871 Hypo-osmolality and hyponatremia: Secondary | ICD-10-CM | POA: Diagnosis not present

## 2024-04-04 DIAGNOSIS — K8050 Calculus of bile duct without cholangitis or cholecystitis without obstruction: Principal | ICD-10-CM

## 2024-04-04 HISTORY — DX: Hyperlipidemia, unspecified: E78.5

## 2024-04-04 HISTORY — PX: CHOLECYSTECTOMY: SHX55

## 2024-04-04 LAB — URINALYSIS, W/ REFLEX TO CULTURE (INFECTION SUSPECTED)
Bacteria, UA: NONE SEEN
Bilirubin Urine: NEGATIVE
Glucose, UA: NEGATIVE mg/dL
Ketones, ur: 5 mg/dL — AB
Nitrite: NEGATIVE
Protein, ur: 100 mg/dL — AB
Specific Gravity, Urine: 1.016 (ref 1.005–1.030)
pH: 6 (ref 5.0–8.0)

## 2024-04-04 LAB — CBC WITH DIFFERENTIAL/PLATELET
Abs Immature Granulocytes: 0.05 K/uL (ref 0.00–0.07)
Basophils Absolute: 0 K/uL (ref 0.0–0.1)
Basophils Relative: 0 %
Eosinophils Absolute: 0 K/uL (ref 0.0–0.5)
Eosinophils Relative: 0 %
HCT: 37.8 % (ref 36.0–46.0)
Hemoglobin: 12.7 g/dL (ref 12.0–15.0)
Immature Granulocytes: 0 %
Lymphocytes Relative: 8 %
Lymphs Abs: 1.2 K/uL (ref 0.7–4.0)
MCH: 28.5 pg (ref 26.0–34.0)
MCHC: 33.6 g/dL (ref 30.0–36.0)
MCV: 84.8 fL (ref 80.0–100.0)
Monocytes Absolute: 1.4 K/uL — ABNORMAL HIGH (ref 0.1–1.0)
Monocytes Relative: 10 %
Neutro Abs: 11.9 K/uL — ABNORMAL HIGH (ref 1.7–7.7)
Neutrophils Relative %: 82 %
Platelets: 312 K/uL (ref 150–400)
RBC: 4.46 MIL/uL (ref 3.87–5.11)
RDW: 12.8 % (ref 11.5–15.5)
WBC: 14.6 K/uL — ABNORMAL HIGH (ref 4.0–10.5)
nRBC: 0 % (ref 0.0–0.2)

## 2024-04-04 LAB — COMPREHENSIVE METABOLIC PANEL WITH GFR
ALT: 269 U/L — ABNORMAL HIGH (ref 0–44)
AST: 220 U/L — ABNORMAL HIGH (ref 15–41)
Albumin: 3.5 g/dL (ref 3.5–5.0)
Alkaline Phosphatase: 189 U/L — ABNORMAL HIGH (ref 38–126)
Anion gap: 8 (ref 5–15)
BUN: 8 mg/dL (ref 6–20)
CO2: 24 mmol/L (ref 22–32)
Calcium: 8.6 mg/dL — ABNORMAL LOW (ref 8.9–10.3)
Chloride: 100 mmol/L (ref 98–111)
Creatinine, Ser: 0.86 mg/dL (ref 0.44–1.00)
GFR, Estimated: >60 mL/min (ref >60)
Glucose, Bld: 132 mg/dL — ABNORMAL HIGH (ref 70–99)
Potassium: 3.5 mmol/L (ref 3.5–5.1)
Sodium: 132 mmol/L — ABNORMAL LOW (ref 135–145)
Total Bilirubin: 2.5 mg/dL — ABNORMAL HIGH (ref 0.0–1.2)
Total Protein: 7.6 g/dL (ref 6.5–8.1)

## 2024-04-04 LAB — HIV ANTIBODY (ROUTINE TESTING W REFLEX): HIV Screen 4th Generation wRfx: NONREACTIVE

## 2024-04-04 LAB — LIPASE, BLOOD: Lipase: 28 U/L (ref 11–51)

## 2024-04-04 LAB — I-STAT CG4 LACTIC ACID, ED: Lactic Acid, Venous: 0.7 mmol/L (ref 0.5–1.9)

## 2024-04-04 LAB — PROTIME-INR
INR: 1.1 (ref 0.8–1.2)
Prothrombin Time: 15.3 s — ABNORMAL HIGH (ref 11.4–15.2)

## 2024-04-04 SURGERY — LAPAROSCOPIC CHOLECYSTECTOMY WITH INTRAOPERATIVE CHOLANGIOGRAM
Anesthesia: General

## 2024-04-04 MED ORDER — CHLORHEXIDINE GLUCONATE 0.12 % MT SOLN
15.0000 mL | Freq: Once | OROMUCOSAL | Status: AC
  Administered 2024-04-04: 15 mL via OROMUCOSAL

## 2024-04-04 MED ORDER — MIDAZOLAM HCL (PF) 2 MG/2ML IJ SOLN
INTRAMUSCULAR | Status: DC | PRN
  Administered 2024-04-04: 2 mg via INTRAVENOUS

## 2024-04-04 MED ORDER — ONDANSETRON HCL 4 MG/2ML IJ SOLN
INTRAMUSCULAR | Status: DC | PRN
  Administered 2024-04-04: 4 mg via INTRAVENOUS

## 2024-04-04 MED ORDER — FENTANYL CITRATE (PF) 50 MCG/ML IJ SOSY
50.0000 ug | PREFILLED_SYRINGE | Freq: Once | INTRAMUSCULAR | Status: AC
  Administered 2024-04-04: 50 ug via INTRAVENOUS
  Filled 2024-04-04: qty 1

## 2024-04-04 MED ORDER — ENOXAPARIN SODIUM 40 MG/0.4ML IJ SOSY
40.0000 mg | PREFILLED_SYRINGE | INTRAMUSCULAR | Status: DC
  Administered 2024-04-05: 40 mg via SUBCUTANEOUS
  Filled 2024-04-04: qty 0.4

## 2024-04-04 MED ORDER — MIDAZOLAM HCL 2 MG/2ML IJ SOLN
INTRAMUSCULAR | Status: AC
  Filled 2024-04-04: qty 2

## 2024-04-04 MED ORDER — FENTANYL CITRATE (PF) 250 MCG/5ML IJ SOLN
INTRAMUSCULAR | Status: DC | PRN
  Administered 2024-04-04: 50 ug via INTRAVENOUS

## 2024-04-04 MED ORDER — HYDROMORPHONE HCL 1 MG/ML IJ SOLN
0.2500 mg | INTRAMUSCULAR | Status: DC | PRN
  Administered 2024-04-04 (×2): 0.5 mg via INTRAVENOUS

## 2024-04-04 MED ORDER — PIPERACILLIN-TAZOBACTAM 3.375 G IVPB
3.3750 g | Freq: Three times a day (TID) | INTRAVENOUS | Status: DC
  Administered 2024-04-04: 3.375 g via INTRAVENOUS
  Filled 2024-04-04: qty 50

## 2024-04-04 MED ORDER — OXYCODONE HCL 5 MG PO TABS
5.0000 mg | ORAL_TABLET | Freq: Four times a day (QID) | ORAL | Status: DC | PRN
  Administered 2024-04-04 – 2024-04-05 (×4): 5 mg via ORAL
  Filled 2024-04-04 (×4): qty 1

## 2024-04-04 MED ORDER — SODIUM CHLORIDE 0.9 % IV SOLN
2.0000 g | INTRAVENOUS | Status: DC
  Administered 2024-04-04: 2 g via INTRAVENOUS
  Filled 2024-04-04: qty 20

## 2024-04-04 MED ORDER — SCOPOLAMINE 1 MG/3DAYS TD PT72
MEDICATED_PATCH | TRANSDERMAL | Status: AC
  Administered 2024-04-04: 1 mg via TRANSDERMAL
  Filled 2024-04-04: qty 1

## 2024-04-04 MED ORDER — METOPROLOL TARTRATE 5 MG/5ML IV SOLN: 5.0000 mg | Freq: Four times a day (QID) | INTRAVENOUS | Status: DC | PRN

## 2024-04-04 MED ORDER — PROCHLORPERAZINE EDISYLATE 10 MG/2ML IJ SOLN: 5.0000 mg | Freq: Four times a day (QID) | INTRAMUSCULAR | Status: DC | PRN

## 2024-04-04 MED ORDER — SODIUM CHLORIDE 0.9 % IV BOLUS (SEPSIS)
1000.0000 mL | Freq: Once | INTRAVENOUS | Status: AC
  Administered 2024-04-04: 1000 mL via INTRAVENOUS

## 2024-04-04 MED ORDER — ONDANSETRON HCL 4 MG/2ML IJ SOLN: 4.0000 mg | Freq: Once | INTRAMUSCULAR | Status: DC | PRN

## 2024-04-04 MED ORDER — ACETAMINOPHEN 325 MG PO TABS
650.0000 mg | ORAL_TABLET | Freq: Four times a day (QID) | ORAL | Status: DC | PRN
  Administered 2024-04-04 – 2024-04-05 (×3): 650 mg via ORAL
  Filled 2024-04-04 (×3): qty 2

## 2024-04-04 MED ORDER — ORAL CARE MOUTH RINSE: 15.0000 mL | Freq: Once | OROMUCOSAL | Status: AC

## 2024-04-04 MED ORDER — DEXAMETHASONE SOD PHOSPHATE PF 10 MG/ML IJ SOLN
INTRAMUSCULAR | Status: DC | PRN
  Administered 2024-04-04: 10 mg via INTRAVENOUS

## 2024-04-04 MED ORDER — MELATONIN 5 MG PO TABS
5.0000 mg | ORAL_TABLET | Freq: Every evening | ORAL | Status: DC | PRN
Start: 2024-04-04 — End: 2024-04-05
  Administered 2024-04-04: 5 mg via ORAL
  Filled 2024-04-04: qty 1

## 2024-04-04 MED ORDER — DEXTROSE-SODIUM CHLORIDE 5-0.9 % IV SOLN: INTRAVENOUS | Status: DC

## 2024-04-04 MED ORDER — PIPERACILLIN-TAZOBACTAM 3.375 G IVPB 30 MIN
3.3750 g | Freq: Once | INTRAVENOUS | Status: AC
  Administered 2024-04-04: 3.375 g via INTRAVENOUS
  Filled 2024-04-04: qty 50

## 2024-04-04 MED ORDER — ROCURONIUM BROMIDE 10 MG/ML (PF) SYRINGE
PREFILLED_SYRINGE | INTRAVENOUS | Status: DC | PRN
  Administered 2024-04-04: 50 mg via INTRAVENOUS
  Administered 2024-04-04: 10 mg via INTRAVENOUS
  Administered 2024-04-04: 20 mg via INTRAVENOUS

## 2024-04-04 MED ORDER — POLYETHYLENE GLYCOL 3350 17 G PO PACK: 17.0000 g | PACK | Freq: Every day | ORAL | Status: DC | PRN

## 2024-04-04 MED ORDER — SODIUM CHLORIDE 0.9 % IV SOLN: INTRAVENOUS | Status: DC

## 2024-04-04 MED ORDER — SUGAMMADEX SODIUM 200 MG/2ML IV SOLN
INTRAVENOUS | Status: DC | PRN
Start: 2024-04-04 — End: 2024-04-04
  Administered 2024-04-04: 154.2 mg via INTRAVENOUS

## 2024-04-04 MED ORDER — OXYCODONE HCL 5 MG PO TABS
5.0000 mg | ORAL_TABLET | Freq: Once | ORAL | Status: AC | PRN
  Administered 2024-04-04: 5 mg via ORAL

## 2024-04-04 MED ORDER — OXYCODONE HCL 5 MG PO TABS
ORAL_TABLET | ORAL | Status: AC
  Filled 2024-04-04: qty 1

## 2024-04-04 MED ORDER — METHOCARBAMOL 500 MG PO TABS
500.0000 mg | ORAL_TABLET | Freq: Four times a day (QID) | ORAL | Status: DC | PRN
  Administered 2024-04-04: 500 mg via ORAL
  Filled 2024-04-04: qty 1

## 2024-04-04 MED ORDER — HEMOSTATIC AGENTS (NO CHARGE) OPTIME
TOPICAL | Status: DC | PRN
  Administered 2024-04-04 (×3): 1 via TOPICAL

## 2024-04-04 MED ORDER — SCOPOLAMINE 1 MG/3DAYS TD PT72: 1.0000 | MEDICATED_PATCH | TRANSDERMAL | Status: DC

## 2024-04-04 MED ORDER — LIDOCAINE 2% (20 MG/ML) 5 ML SYRINGE
INTRAMUSCULAR | Status: DC | PRN
  Administered 2024-04-04: 60 mg via INTRAVENOUS

## 2024-04-04 MED ORDER — PROPOFOL 10 MG/ML IV BOLUS
INTRAVENOUS | Status: AC
  Filled 2024-04-04: qty 20

## 2024-04-04 MED ORDER — DROPERIDOL 2.5 MG/ML IJ SOLN: 0.6250 mg | Freq: Once | INTRAMUSCULAR | Status: DC | PRN

## 2024-04-04 MED ORDER — FENTANYL CITRATE (PF) 250 MCG/5ML IJ SOLN
INTRAMUSCULAR | Status: AC
  Filled 2024-04-04: qty 5

## 2024-04-04 MED ORDER — SODIUM CHLORIDE 0.9 % IV SOLN
INTRAVENOUS | Status: DC | PRN
  Administered 2024-04-04: 40 mL

## 2024-04-04 MED ORDER — PROPOFOL 10 MG/ML IV BOLUS
INTRAVENOUS | Status: DC | PRN
Start: 2024-04-04 — End: 2024-04-04
  Administered 2024-04-04: 140 mg via INTRAVENOUS

## 2024-04-04 MED ORDER — BUPIVACAINE-EPINEPHRINE 0.25% -1:200000 IJ SOLN
INTRAMUSCULAR | Status: DC | PRN
  Administered 2024-04-04: 6 mL

## 2024-04-04 MED ORDER — HYDROMORPHONE HCL 1 MG/ML IJ SOLN: 0.5000 mg | INTRAMUSCULAR | Status: DC | PRN

## 2024-04-04 MED ORDER — HYDROMORPHONE HCL 1 MG/ML IJ SOLN
INTRAMUSCULAR | Status: AC
  Filled 2024-04-04: qty 1

## 2024-04-04 MED ORDER — GADOBUTROL 1 MMOL/ML IV SOLN
8.0000 mL | Freq: Once | INTRAVENOUS | Status: AC | PRN
  Administered 2024-04-04: 8 mL via INTRAVENOUS

## 2024-04-04 MED ORDER — LACTATED RINGERS IV SOLN: INTRAVENOUS | Status: DC

## 2024-04-04 MED ORDER — PIPERACILLIN-TAZOBACTAM 3.375 G IVPB 30 MIN: 3.3750 g | Freq: Three times a day (TID) | INTRAVENOUS | Status: DC

## 2024-04-04 MED ORDER — OXYCODONE HCL 5 MG/5ML PO SOLN: 5.0000 mg | Freq: Once | ORAL | Status: AC | PRN

## 2024-04-04 SURGICAL SUPPLY — 37 items
BAG COUNTER SPONGE SURGICOUNT (BAG) ×2 IMPLANT
BLADE CLIPPER SURG (BLADE) IMPLANT
CANISTER SUCTION 3000ML PPV (SUCTIONS) ×2 IMPLANT
CHLORAPREP W/TINT 26 (MISCELLANEOUS) ×2 IMPLANT
CLIP APPLIE ROT 10 11.4 M/L (STAPLE) ×2 IMPLANT
COVER MAYO STAND STRL (DRAPES) ×2 IMPLANT
COVER SURGICAL LIGHT HANDLE (MISCELLANEOUS) ×2 IMPLANT
DERMABOND ADVANCED .7 DNX12 (GAUZE/BANDAGES/DRESSINGS) ×2 IMPLANT
DERMABOND ADVANCED .7 DNX6 (GAUZE/BANDAGES/DRESSINGS) IMPLANT
DRAPE C-ARM 42X120 X-RAY (DRAPES) ×2 IMPLANT
ELECTRODE REM PT RTRN 9FT ADLT (ELECTROSURGICAL) ×2 IMPLANT
GLOVE BIO SURGEON STRL SZ8 (GLOVE) ×2 IMPLANT
GLOVE BIOGEL PI IND STRL 8 (GLOVE) ×2 IMPLANT
GOWN STRL REUS W/ TWL LRG LVL3 (GOWN DISPOSABLE) ×4 IMPLANT
GOWN STRL REUS W/ TWL XL LVL3 (GOWN DISPOSABLE) ×2 IMPLANT
HEMOSTAT SNOW SURGICEL 2X4 (HEMOSTASIS) IMPLANT
IRRIGATION SUCT STRKRFLW 2 WTP (MISCELLANEOUS) ×2 IMPLANT
KIT BASIN OR (CUSTOM PROCEDURE TRAY) ×2 IMPLANT
KIT IMAGING PINPOINTPAQ (MISCELLANEOUS) IMPLANT
KIT TURNOVER KIT B (KITS) ×2 IMPLANT
PAD ARMBOARD POSITIONER FOAM (MISCELLANEOUS) ×2 IMPLANT
POUCH RETRIEVAL ECOSAC 10 (ENDOMECHANICALS) ×2 IMPLANT
SCISSORS LAP 5X35 DISP (ENDOMECHANICALS) ×2 IMPLANT
SET CHOLANGIOGRAPH 5 50 .035 (SET/KITS/TRAYS/PACK) ×2 IMPLANT
SET TUBE SMOKE EVAC HIGH FLOW (TUBING) ×2 IMPLANT
SLEEVE Z-THREAD 5X100MM (TROCAR) ×2 IMPLANT
SOLN 0.9% NACL POUR BTL 1000ML (IV SOLUTION) ×2 IMPLANT
SOLN STERILE WATER BTL 1000 ML (IV SOLUTION) ×2 IMPLANT
SUT MNCRL AB 4-0 PS2 18 (SUTURE) ×2 IMPLANT
SUT VICRYL 0 UR6 27IN ABS (SUTURE) IMPLANT
TOWEL GREEN STERILE (TOWEL DISPOSABLE) ×2 IMPLANT
TOWEL GREEN STERILE FF (TOWEL DISPOSABLE) ×2 IMPLANT
TRAY LAPAROSCOPIC MC (CUSTOM PROCEDURE TRAY) ×2 IMPLANT
TROCAR 11X100 Z THREAD (TROCAR) ×2 IMPLANT
TROCAR BALLN 12MMX100 BLUNT (TROCAR) ×2 IMPLANT
TROCAR Z-THREAD OPTICAL 5X100M (TROCAR) ×2 IMPLANT
WARMER LAPAROSCOPE (MISCELLANEOUS) ×2 IMPLANT

## 2024-04-04 NOTE — Transfer of Care (Signed)
 Immediate Anesthesia Transfer of Care Note  Patient: Kristen Wells  Procedure(s) Performed: LAPAROSCOPIC CHOLECYSTECTOMY WITH INTRAOPERATIVE CHOLANGIOGRAM  Patient Location: PACU  Anesthesia Type:General  Level of Consciousness: drowsy  Airway & Oxygen Therapy: Patient Spontanous Breathing  Post-op Assessment: Report given to RN and Post -op Vital signs reviewed and stable  Post vital signs: Reviewed and stable  Last Vitals:  Vitals Value Taken Time  BP 129/68 04/04/24 15:11  Temp 37.2 C 04/04/24 15:10  Pulse 76 04/04/24 15:15  Resp 19 04/04/24 15:15  SpO2 96 % 04/04/24 15:15  Vitals shown include unfiled device data.  Last Pain:  Vitals:   04/04/24 1510  TempSrc:   PainSc: Asleep         Complications: No notable events documented.

## 2024-04-04 NOTE — Plan of Care (Signed)
   Problem: Health Behavior/Discharge Planning: Goal: Ability to manage health-related needs will improve Outcome: Progressing   Problem: Clinical Measurements: Goal: Ability to maintain clinical measurements within normal limits will improve Outcome: Progressing

## 2024-04-04 NOTE — ED Notes (Signed)
 Patient to MRI.

## 2024-04-04 NOTE — Op Note (Signed)
 Laparoscopic Cholecystectomy with IOC Procedure Note  Indications: This patient presents with symptomatic gallbladder disease and will undergo laparoscopic cholecystectomy.  Pre-operative Diagnosis: Calculus of gallbladder with acute cholecystitis, without mention of obstruction  Post-operative Diagnosis: Same  Surgeon: Debby DELENA Shipper MD  Assistants: Or staff  Anesthesia: General endotracheal anesthesia and Local anesthesia 0.25.% bupivacaine  ASA Class: 2 CASE DATA: Type of patient?: DOW CASE (Surgical Hospitalist [facility]) Status of Case? URGENT Add On Infection Present At Time Of Surgery (PATOS)?  INFECTION  Procedure Details  The patient was seen again in the Holding Room. The risks, benefits, complications, treatment options, and expected outcomes were discussed with the patient. The possibilities of reaction to medication, pulmonary aspiration, perforation of viscus, bleeding, recurrent infection, finding a normal gallbladder, the need for additional procedures, failure to diagnose a condition, the possible need to convert to an open procedure, and creating a complication requiring transfusion or operation were discussed with the patient. The patient and/or family concurred with the proposed plan, giving informed consent. The site of surgery properly noted/marked. The patient was taken to Operating Room, identified as Kristen Wells and the procedure verified as Laparoscopic Cholecystectomy with Intraoperative Cholangiograms. A Time Out was held and the above information confirmed.  Prior to the induction of general anesthesia, antibiotic prophylaxis was administered. General endotracheal anesthesia was then administered and tolerated well. After the induction, the abdomen was prepped in the usual sterile fashion. The patient was positioned in the supine position with the left arm comfortably tucked, along with some reverse Trendelenburg.  Local anesthetic agent was injected into the  skin near the umbilicus and an incision made. The midline fascia was incised and the Hasson technique was used to introduce a 12 mm port under direct vision. It was secured with a figure of eight Vicryl suture placed in the usual fashion. Pneumoperitoneum was then created with CO2 and tolerated well without any adverse changes in the patient's vital signs. Additional trocars were introduced under direct vision with an 11 mm trocar in the epigastrium and 2 5 mm trocars in the right upper quadrant. All skin incisions were infiltrated with a local anesthetic agent before making the incision and placing the trocars.   The gallbladder was identified, the fundus grasped and retracted cephalad. Adhesions were lysed bluntly and with the electrocautery where indicated, taking care not to injure any adjacent organs or viscus. The infundibulum was grasped and retracted laterally, exposing the peritoneum overlying the triangle of Calot. This was then divided and exposed in a blunt fashion. The cystic duct was clearly identified and bluntly dissected circumferentially. The junctions of the gallbladder, cystic duct and common bile duct were clearly identified prior to the division of any linear structure.  The gallbladder had signs of gangrenous change and was quite friable.  An incision was made in the cystic duct and the cholangiogram catheter introduced. The catheter was secured using an endoclip. The study showed no stones and good visualization of the distal and proximal biliary tree. The catheter was then removed.   The cystic duct was then  ligated with surgical clips  on the patient side and  clipped on the gallbladder side and divided. The cystic artery was identified, dissected free, ligated with clips and divided as well. Posterior cystic artery clipped and divided.  The gallbladder was dissected from the liver bed in retrograde fashion with the electrocautery. The gallbladder was removed and placed into a  pouch.. The liver bed was irrigated and inspected. Hemostasis was  achieved with the electrocautery and Surgicel snow.  There is significant oozing due to the necrotic nature of the gallbladder.. Copious irrigation was utilized and was repeatedly aspirated until clear all particulate matter. Hemostasis was achieved with no signs  Of bleeding or bile leakage.  Pneumoperitoneum was completely reduced after viewing removal of the trocars under direct vision. The wound was thoroughly irrigated and the fascia was then closed with a figure of eight suture; the skin was then closed with 4-0 Monocryl and a sterile dressing of Dermabond was applied.  Instrument, sponge, and needle counts were correct at closure and at the conclusion of the case.   Findings: Cholecystitis with Cholelithiasis  Estimated Blood Loss: less than 100 mL         Drains: None         Total IV Fluids: Per OR record         Specimens: Gallbladder           Complications: None; patient tolerated the procedure well.         Disposition: PACU - hemodynamically stable.         Condition: stable

## 2024-04-04 NOTE — ED Provider Notes (Signed)
 [place] EMERGENCY DEPARTMENT AT [facility] Provider Note   CSN: [id number] Arrival date & time: 04/04/24  9771     Patient presents with: Fever   Kristen Wells is a 58 y.o. female.   The history is provided by the patient.  Patient with history of hyperlipidemia presents with fever and abdominal pain.  Patient was recently seen in the ER and was found to have gallbladder stones.  Over the past day she has had increasing pain in her abdomen and now having fever with nausea and vomiting.  No diarrhea.  No dysuria.  No cough or sore throat.  She reports a mild headache. Only previous surgeries or the distant past and related to pregnancy   Prior to Admission medications   Medication Sig Start Date End Date Taking? Authorizing Provider  CALCIUM-MAGNESIUM-ZINC PO Take 1 tablet by mouth daily.    [provider]  ferrous sulfate 325 (65 FE) MG tablet Take 325 mg by mouth every other day.    [provider]  Maca Root (MACA PO) Take by mouth daily.    [provider]  morphine (MSIR) 15 MG tablet Take 0.5 tablets (7.5 mg total) by mouth every 4 (four) hours as needed. 04/02/24   Emil Share, DO  ondansetron  (ZOFRAN -ODT) 4 MG disintegrating tablet 4mg  ODT q4 hours prn nausea/vomit 04/02/24   Floyd, Dan, DO    Allergies: Patient has no known allergies.    Review of Systems  Constitutional:  Positive for chills and fever.  HENT:  Negative for sore throat.   Respiratory:  Negative for cough.   Gastrointestinal:  Positive for abdominal pain and vomiting. Negative for diarrhea.    Updated Vital Signs BP 127/71   Pulse 92   Temp 99.8 F (37.7 C) (Oral)   Resp 15   Ht 1.702 m (5' 7)   Wt 77.1 kg   SpO2 94%   BMI 26.63 kg/m   Physical Exam CONSTITUTIONAL: Well developed/well nourished, no distress HEAD: Normocephalic/atraumatic EYES: EOMI/PERRL ENMT: Mucous membranes moist NECK: supple no meningeal signs CV: S1/S2 noted, no  murmurs/rubs/gallops noted LUNGS: Lungs are clear to auscultation bilaterally, no apparent distress ABDOMEN: soft, mild epigastric tenderness, no rebound or guarding, bowel sounds noted throughout abdomen NEURO: Pt is awake/alert/appropriate, moves all extremitiesx4.  No facial droop.   SKIN: warm, color normal, no rash PSYCH: Mildly anxious (all labs ordered are listed, but only abnormal results are displayed) Labs Reviewed  COMPREHENSIVE METABOLIC PANEL WITH GFR - Abnormal; Notable for the following components:      Result Value   Sodium 132 (*)    Glucose, Bld 132 (*)    Calcium 8.6 (*)    AST 220 (*)    ALT 269 (*)    Alkaline Phosphatase 189 (*)    Total Bilirubin 2.5 (*)    All other components within normal limits  CBC WITH DIFFERENTIAL/PLATELET - Abnormal; Notable for the following components:   WBC 14.6 (*)    Neutro Abs 11.9 (*)    Monocytes Absolute 1.4 (*)    All other components within normal limits  PROTIME-INR - Abnormal; Notable for the following components:   Prothrombin Time 15.3 (*)    All other components within normal limits  URINALYSIS, W/ REFLEX TO CULTURE (INFECTION SUSPECTED) - Abnormal; Notable for the following components:   Color, Urine AMBER (*)    APPearance HAZY (*)    Hgb urine dipstick SMALL (*)    Ketones, ur 5 (*)  Protein, ur 100 (*)    Leukocytes,Ua TRACE (*)    All other components within normal limits  CULTURE, BLOOD (ROUTINE X 2)  CULTURE, BLOOD (ROUTINE X 2)  LIPASE, BLOOD  I-STAT CG4 LACTIC ACID, ED    EKG: None  Radiology: US  Abdomen Limited RUQ (LIVER/GB) Result Date: 04/02/2024 CLINICAL DATA:  Right upper quadrant pain. EXAM: ULTRASOUND ABDOMEN LIMITED RIGHT UPPER QUADRANT COMPARISON:  None Available. FINDINGS: Gallbladder: A 2.1 cm gallstone is seen within the gallbladder neck. There is no evidence of gallbladder wall thickening (2.6 mm). A mild amount of pericholecystic fluid is noted. No sonographic Murphy sign noted by  sonographer. Common bile duct: Diameter: 2.8 mm Liver: No focal lesion identified. Diffusely increased echogenicity of the liver parenchyma is noted. Portal vein is patent on color Doppler imaging with normal direction of blood flow towards the liver. Other: None. IMPRESSION: 1. Cholelithiasis, as described above, and pericholecystic fluid without additional evidence to suggest the presence of acute cholecystitis. 2. Hepatic steatosis. Electronically Signed   By: Suzen Dials M.D.   On: 04/02/2024 19:12   CT Angio Chest PE W and/or Wo Contrast Result Date: 04/02/2024 CLINICAL DATA:  Pulmonary embolism (PE) suspected, low to intermediate prob, positive D-dimer, epigastric abdominal pain and vomiting since yesterday EXAM: CT ANGIOGRAPHY CHEST WITH CONTRAST TECHNIQUE: Multidetector CT imaging of the chest was performed using the standard protocol during bolus administration of intravenous contrast. Multiplanar CT image reconstructions and MIPs were obtained to evaluate the vascular anatomy. RADIATION DOSE REDUCTION: This exam was performed according to the departmental dose-optimization program which includes automated exposure control, adjustment of the mA and/or kV according to patient size and/or use of iterative reconstruction technique. CONTRAST:  75mL OMNIPAQUE IOHEXOL 350 MG/ML SOLN COMPARISON:  04/02/2024 FINDINGS: Cardiovascular: This is a technically adequate evaluation of the pulmonary vasculature. No filling defects or pulmonary emboli. The heart is unremarkable without pericardial effusion. No evidence of thoracic aortic aneurysm or dissection. Mediastinum/Nodes: No enlarged mediastinal, hilar, or axillary lymph nodes. Thyroid gland, trachea, and esophagus demonstrate no significant findings. Lungs/Pleura: No acute airspace disease, effusion, or pneumothorax. Central airways are patent. Upper Abdomen: Partial visualization of a distended gallbladder. If gallbladder pathology is suspected, right  upper quadrant ultrasound may be useful. No other acute upper abdominal findings. Musculoskeletal: No acute or destructive bony abnormalities. Reconstructed images demonstrate no additional findings. Review of the MIP images confirms the above findings. IMPRESSION: 1. No evidence of pulmonary embolus. 2. No acute intrathoracic process. 3. Partial visualization of a distended gallbladder. If gallbladder pathology is suspected, right upper quadrant ultrasound may be useful. Electronically Signed   By: Ozell Daring M.D.   On: 04/02/2024 16:59   DG Chest 1 View Result Date: 04/02/2024 CLINICAL DATA:  Chest pressure EXAM: CHEST  1 VIEW COMPARISON:  None Available. FINDINGS: The heart size and mediastinal contours are within normal limits. Both lungs are clear. The visualized skeletal structures are unremarkable. IMPRESSION: No active disease. Electronically Signed   By: Luke Bun M.D.   On: 04/02/2024 15:25     Procedures   Medications Ordered in the ED  piperacillin-tazobactam (ZOSYN) IVPB 3.375 g (0 g Intravenous Stopped 04/04/24 0457)  sodium chloride  0.9 % bolus 1,000 mL (1,000 mLs Intravenous New Bag/Given 04/04/24 0426)  fentaNYL  (SUBLIMAZE ) injection 50 mcg (50 mcg Intravenous Given 04/04/24 0505)    Clinical Course as of 04/04/24 0514  Thu Apr 04, 2024  0427 Patient presents with increasing abdominal pain and fever.  Patient recently diagnosed  with cholelithiasis, but after going home started having increasing pain and fever  Patient did be febrile and tachycardic here her lactate is normal.  However she has leukocytosis, new transaminitis and elevated bilirubin. I discussed the case with Dr. Paola with general surgery. Patient is now showing signs of choledocholithiasis.  She agrees with antibiotics, and [facility] hospitalist admission Patient likely need further imaging potentially MRCP [DW]  0513 Overall patient's vitals are improving.  We discussed the plan for admission and likely  operative management. Patient agreeable plan. Discussed with Dr. Shona for admission to hospitalist [DW]    Clinical Course User Index [DW] Midge Golas, MD                                 Medical Decision Making Amount and/or Complexity of Data Reviewed Labs: ordered.  Risk Prescription drug management. Decision regarding hospitalization.   This patient presents to the ED for concern of abdominal pain and fever, this involves an extensive number of treatment options, and is a complaint that carries with it a high risk of complications and morbidity.  The differential diagnosis includes but is not limited to cholecystitis, cholelithiasis, pancreatitis, gastritis, peptic ulcer disease, appendicitis, bowel obstruction, bowel perforation, diverticulitis, pyelonephritis, urinary tract infection   Comorbidities that complicate the patient evaluation: Patient's presentation is complicated by their history of hyperlipidemia  Additional history obtained: Records reviewed Care Everywhere/External Records  Lab Tests: I Ordered, and personally interpreted labs.  The pertinent results include: Leukocytosis, transaminitis  Medicines ordered and prescription drug management: I ordered medication including IV Zosyn for presumed choledocholithiasis Reevaluation of the patient after these medicines showed that the patient    stayed the same   Critical Interventions:   IV antibiotics and admission  Consultations Obtained: I requested consultation with the admitting physician [facility] and consultant general surgery, and discussed  findings as well as pertinent plan - they recommend: Admit to hospitalist  Reevaluation: After the interventions noted above, I reevaluated the patient and found that they have :improved  Complexity of problems addressed: Patient's presentation is most consistent with  acute presentation with potential threat to life or bodily function  Disposition: After  consideration of the diagnostic results and the patient's response to treatment,  I feel that the patent would benefit from admission  .        Final diagnoses:  Choledocholithiasis    ED Discharge Orders     None          Midge Golas, MD 04/04/24 [phone]

## 2024-04-04 NOTE — ED Notes (Signed)
Pt taken to short stay

## 2024-04-04 NOTE — Anesthesia Postprocedure Evaluation (Signed)
 Anesthesia Post Note  Patient: Kristen Wells  Procedure(s) Performed: LAPAROSCOPIC CHOLECYSTECTOMY WITH INTRAOPERATIVE CHOLANGIOGRAM     Patient location during evaluation: PACU Anesthesia Type: General Level of consciousness: awake and alert Pain management: pain level controlled Vital Signs Assessment: post-procedure vital signs reviewed and stable Respiratory status: spontaneous breathing, nonlabored ventilation, respiratory function stable and patient connected to nasal cannula oxygen Cardiovascular status: blood pressure returned to baseline and stable Postop Assessment: no apparent nausea or vomiting Anesthetic complications: no   No notable events documented.  Last Vitals:  Vitals:   04/04/24 1530 04/04/24 1545  BP: 121/83 128/68  Pulse: 77 81  Resp: 18 18  Temp:    SpO2: 96% 95%    Last Pain:  Vitals:   04/04/24 1600  TempSrc:   PainSc: Asleep                 Rome Ade

## 2024-04-04 NOTE — Consult Note (Signed)
 H&P Note  Kristen Wells 1965/11/06  [id number].    Requesting MD: Terry Hurst, DO Chief Complaint/Reason for Consult: Acute cholecystitis  HPI:  Patient is an otherwise healthy 58 year old female who presented to the ED with abdominal pain, nausea and vomiting since Sunday. Initially she reports pain was more epigastric and she thought she was just having some heartburn. Pain lessened but then became more intense Tuesday and she got evaluated in the ED. At that time stone was noted in the gallbladder neck but no signs of cholecystitis noted on US . Patient elected to go home as pain was improved. Pain returned and was more in RUQ and she has been unable to get any food down. She notes associated fever at home. No significant reported PMH. Prior abdominal surgery includes cesarean. Not on blood thinners. NKDA.   ROS: Negative other than HPI  No family history on file.  No past medical history on file.  Social History:  reports that she has never smoked. She has never used smokeless tobacco. She reports current alcohol use. She reports that she does not use drugs.  Allergies: No Known Allergies  (Not in a hospital admission)   Blood pressure 124/70, pulse 97, temperature 99.3 F (37.4 C), temperature source Oral, resp. rate 19, height 5' 7 (1.702 m), weight 77.1 kg, SpO2 97%. Physical Exam:  General: pleasant, WD,  WN female who is laying in bed in NAD HEENT: head is normocephalic, atraumatic.  Sclera are anicteric.  PERRL.  Ears and nose without any masses or lesions.  Mouth is pink and moist Heart: regular, rate, and rhythm.  Normal s1,s2. No obvious murmurs, gallops, or rubs noted.  Palpable radial and pedal pulses bilaterally Lungs: CTAB, no wheezes, rhonchi, or rales noted.  Respiratory effort nonlabored Abd: soft, TTP in RUQ, ND, no masses, hernias, or organomegaly MS: all 4 extremities are symmetrical with no cyanosis, clubbing, or edema. Skin: warm and dry with no  masses, lesions, or rashes Neuro: Cranial nerves 2-12 grossly intact, sensation is normal throughout Psych: A&Ox3 with an appropriate affect.   Results for orders placed or performed during the hospital encounter of 04/04/24 (from the past 48 hours)  Culture, blood (Routine x 2)     Status: None (Preliminary result)   Collection Time: 04/04/24  3:28 AM   Specimen: BLOOD  Result Value Ref Range   Specimen Description BLOOD SITE NOT SPECIFIED    Special Requests      BOTTLES DRAWN AEROBIC AND ANAEROBIC Blood Culture adequate volume   Culture      NO GROWTH < 12 HOURS Performed at [facility], [address] [place], [place] [address]    Report Status PENDING   Comprehensive metabolic panel     Status: Abnormal   Collection Time: 04/04/24  3:33 AM  Result Value Ref Range   Sodium 132 (L) 135 - 145 mmol/L   Potassium 3.5 3.5 - 5.1 mmol/L   Chloride 100 98 - 111 mmol/L   CO2 24 22 - 32 mmol/L   Glucose, Bld 132 (H) 70 - 99 mg/dL    Comment: Glucose reference range applies only to samples taken after fasting for at least 8 hours.   BUN 8 6 - 20 mg/dL   Creatinine, Ser 9.13 0.44 - 1.00 mg/dL   Calcium 8.6 (L) 8.9 - 10.3 mg/dL   Total Protein 7.6 6.5 - 8.1 g/dL   Albumin 3.5 3.5 - 5.0 g/dL  AST 220 (H) 15 - 41 U/L   ALT 269 (H) 0 - 44 U/L   Alkaline Phosphatase 189 (H) 38 - 126 U/L   Total Bilirubin 2.5 (H) 0.0 - 1.2 mg/dL   GFR, Estimated >39 >39 mL/min    Comment: (NOTE) Calculated using the CKD-EPI Creatinine Equation (2021)    Anion gap 8 5 - 15    Comment: Performed at [facility], [address], [place], [place] [address]  CBC with Differential     Status: Abnormal   Collection Time: 04/04/24  3:33 AM  Result Value Ref Range   WBC 14.6 (H) 4.0 - 10.5 K/uL   RBC 4.46 3.87 - 5.11 MIL/uL   Hemoglobin 12.7 12.0 - 15.0 g/dL   HCT 62.1 63.9 - 53.9 %   MCV 84.8 80.0 - 100.0 fL   MCH 28.5 26.0 - 34.0 pg   MCHC 33.6 30.0 - 36.0 g/dL   RDW 87.1 88.4 - 84.4 %    Platelets 312 150 - 400 K/uL   nRBC 0.0 0.0 - 0.2 %   Neutrophils Relative % 82 %   Neutro Abs 11.9 (H) 1.7 - 7.7 K/uL   Lymphocytes Relative 8 %   Lymphs Abs 1.2 0.7 - 4.0 K/uL   Monocytes Relative 10 %   Monocytes Absolute 1.4 (H) 0.1 - 1.0 K/uL   Eosinophils Relative 0 %   Eosinophils Absolute 0.0 0.0 - 0.5 K/uL   Basophils Relative 0 %   Basophils Absolute 0.0 0.0 - 0.1 K/uL   Immature Granulocytes 0 %   Abs Immature Granulocytes 0.05 0.00 - 0.07 K/uL    Comment: Performed at [facility], [address] [place], [place] [address]  Protime-INR     Status: Abnormal   Collection Time: 04/04/24  3:33 AM  Result Value Ref Range   Prothrombin Time 15.3 (H) 11.4 - 15.2 seconds   INR 1.1 0.8 - 1.2    Comment: (NOTE) INR goal varies based on device and disease states. Performed at [facility], [address], [place], [place] [address]   I-Stat Lactic Acid, ED     Status: None   Collection Time: 04/04/24  3:35 AM  Result Value Ref Range   Lactic Acid, Venous 0.7 0.5 - 1.9 mmol/L  Urinalysis, w/ Reflex to Culture (Infection Suspected) -Urine, Clean Catch     Status: Abnormal   Collection Time: 04/04/24  3:43 AM  Result Value Ref Range   Specimen Source URINE, CATHETERIZED    Color, Urine AMBER (A) YELLOW    Comment: BIOCHEMICALS MAY BE AFFECTED BY COLOR   APPearance HAZY (A) CLEAR   Specific Gravity, Urine 1.016 1.005 - 1.030   pH 6.0 5.0 - 8.0   Glucose, UA NEGATIVE NEGATIVE mg/dL   Hgb urine dipstick SMALL (A) NEGATIVE   Bilirubin Urine NEGATIVE NEGATIVE   Ketones, ur 5 (A) NEGATIVE mg/dL   Protein, ur 899 (A) NEGATIVE mg/dL   Nitrite NEGATIVE NEGATIVE   Leukocytes,Ua TRACE (A) NEGATIVE   RBC / HPF 0-5 0 - 5 RBC/hpf   WBC, UA 6-10 0 - 5 WBC/hpf    Comment:        Reflex urine culture not performed if WBC <=10, OR if Squamous epithelial cells >5. If Squamous epithelial cells >5 suggest recollection.    Bacteria, UA NONE SEEN NONE SEEN   Squamous  Epithelial / HPF 0-5 0 - 5 /HPF   Mucus PRESENT  Comment: Performed at [facility], [address], [place], [place] [address]  Culture, blood (Routine x 2)     Status: None (Preliminary result)   Collection Time: 04/04/24  4:02 AM   Specimen: BLOOD LEFT ARM  Result Value Ref Range   Specimen Description BLOOD LEFT ARM    Special Requests      BOTTLES DRAWN AEROBIC AND ANAEROBIC Blood Culture results may not be optimal due to an inadequate volume of blood received in culture bottles   Culture      NO GROWTH <12 HOURS Performed at [facility], [address], [place], [place] [address]    Report Status PENDING   Lipase, blood     Status: None   Collection Time: 04/04/24  4:05 AM  Result Value Ref Range   Lipase 28 11 - 51 U/L    Comment: Performed at [facility], [address], [place], [place] [address]  HIV Antibody (routine testing w rflx)     Status: None   Collection Time: 04/04/24  5:45 AM  Result Value Ref Range   HIV Screen 4th Generation wRfx Non Reactive Non Reactive    Comment: Performed at [facility], [address], [place], [place] [address]   MR ABDOMEN MRCP W WO CONTAST Result Date: 04/04/2024 EXAM: MRCP WITH AND WITHOUT IV CONTRAST 04/04/2024 06:36:35 AM TECHNIQUE: Multisequence, multiplanar magnetic resonance images of the abdomen with and without intravenous contrast. MRCP sequences were performed. 8 mL gadobutrol (GADAVIST) 1 MMOL/ML injection 8 mL GADOBUTROL 1 MMOL/ML IV SOLN. COMPARISON: US  abdomen limited 04/02/2024. CLINICAL HISTORY: Cholelithiasis. FINDINGS: LIVER: Hepatic steatosis. A simple appearing cyst within the posteromedial right lobe of the liver measures 1 cm, image 14/5. No enhancing liver lesions. GALLBLADDER AND BILIARY SYSTEM: There is a 2 cm stone identified within the gallbladder neck. The gallbladder wall thickness measures 6 mm. There is surrounding inflammatory changes within the right upper quadrant of the  abdomen including fat stranding pericholecystic fluid. No common bile duct dilatation. No signs of choledocholithiasis. SPLEEN: The spleen is within normal limits in size and appearance. PANCREAS/PANCREATIC DUCT: No signs of pancreatic inflammation or main duct dilatation. There are numerous T2 hyperintense T1 hypointense lesions involving the pancreas, predominantly within the body and tail. The largest is in the proximal tail measuring 1.8 x 1.2 x 1.2 cm. ADRENAL GLANDS: Normal size and morphology bilaterally. No nodule, thickening, or hemorrhage. No periadrenal stranding. KIDNEYS: Unremarkable. LYMPH NODES: No enlarged abdominal lymph nodes. VASCULATURE: Aortic atherosclerotic calcifications. PERITONEUM: No ascites. ABDOMINAL WALL: No hernia. No mass. BOWEL: Grossly unremarkable. No bowel obstruction. BONES: No acute abnormality or worrisome osseous lesion. SOFT TISSUES: Unremarkable. MISCELLANEOUS: Unremarkable. IMPRESSION: 1. Acute cholecystitis with obstructing 2 cm gallbladder neck stone, gallbladder wall thickening, and pericholecystic inflammatory changes. No choledocholithiasis. 2. 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 Recommend MRI/MRCP follow-up at 6 and 12 months, then annually for 5 years if stable; refer to gastroenterology or surgery if morphology changes. Electronically signed by: Waddell Calk MD 04/04/2024 06:49 AM EDT RP Workstation: [id number]   MR 3D Recon At Scanner Result Date: 04/04/2024 EXAM: MRCP WITH AND WITHOUT IV CONTRAST 04/04/2024 06:36:35 AM TECHNIQUE: Multisequence, multiplanar magnetic resonance images of the abdomen with and without intravenous contrast. MRCP sequences were performed. 8 mL gadobutrol (GADAVIST) 1 MMOL/ML injection 8 mL GADOBUTROL 1 MMOL/ML IV SOLN. COMPARISON: US  abdomen limited 04/02/2024. CLINICAL HISTORY: Cholelithiasis. FINDINGS: LIVER: Hepatic steatosis. A simple appearing  cyst within the posteromedial  right lobe of the liver measures 1 cm, image 14/5. No enhancing liver lesions. GALLBLADDER AND BILIARY SYSTEM: There is a 2 cm stone identified within the gallbladder neck. The gallbladder wall thickness measures 6 mm. There is surrounding inflammatory changes within the right upper quadrant of the abdomen including fat stranding pericholecystic fluid. No common bile duct dilatation. No signs of choledocholithiasis. SPLEEN: The spleen is within normal limits in size and appearance. PANCREAS/PANCREATIC DUCT: No signs of pancreatic inflammation or main duct dilatation. There are numerous T2 hyperintense T1 hypointense lesions involving the pancreas, predominantly within the body and tail. The largest is in the proximal tail measuring 1.8 x 1.2 x 1.2 cm. ADRENAL GLANDS: Normal size and morphology bilaterally. No nodule, thickening, or hemorrhage. No periadrenal stranding. KIDNEYS: Unremarkable. LYMPH NODES: No enlarged abdominal lymph nodes. VASCULATURE: Aortic atherosclerotic calcifications. PERITONEUM: No ascites. ABDOMINAL WALL: No hernia. No mass. BOWEL: Grossly unremarkable. No bowel obstruction. BONES: No acute abnormality or worrisome osseous lesion. SOFT TISSUES: Unremarkable. MISCELLANEOUS: Unremarkable. IMPRESSION: 1. Acute cholecystitis with obstructing 2 cm gallbladder neck stone, gallbladder wall thickening, and pericholecystic inflammatory changes. No choledocholithiasis. 2. 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 Recommend MRI/MRCP follow-up at 6 and 12 months, then annually for 5 years if stable; refer to gastroenterology or surgery if morphology changes. Electronically signed by: Waddell Calk MD 04/04/2024 06:49 AM EDT RP Workstation: [id number]   US  Abdomen Limited RUQ (LIVER/GB) Result Date: 04/02/2024 CLINICAL DATA:  Right upper quadrant pain. EXAM: ULTRASOUND ABDOMEN LIMITED RIGHT UPPER QUADRANT COMPARISON:  None Available. FINDINGS:  Gallbladder: A 2.1 cm gallstone is seen within the gallbladder neck. There is no evidence of gallbladder wall thickening (2.6 mm). A mild amount of pericholecystic fluid is noted. No sonographic Murphy sign noted by sonographer. Common bile duct: Diameter: 2.8 mm Liver: No focal lesion identified. Diffusely increased echogenicity of the liver parenchyma is noted. Portal vein is patent on color Doppler imaging with normal direction of blood flow towards the liver. Other: None. IMPRESSION: 1. Cholelithiasis, as described above, and pericholecystic fluid without additional evidence to suggest the presence of acute cholecystitis. 2. Hepatic steatosis. Electronically Signed   By: Suzen Dials M.D.   On: 04/02/2024 19:12   CT Angio Chest PE W and/or Wo Contrast Result Date: 04/02/2024 CLINICAL DATA:  Pulmonary embolism (PE) suspected, low to intermediate prob, positive D-dimer, epigastric abdominal pain and vomiting since yesterday EXAM: CT ANGIOGRAPHY CHEST WITH CONTRAST TECHNIQUE: Multidetector CT imaging of the chest was performed using the standard protocol during bolus administration of intravenous contrast. Multiplanar CT image reconstructions and MIPs were obtained to evaluate the vascular anatomy. RADIATION DOSE REDUCTION: This exam was performed according to the departmental dose-optimization program which includes automated exposure control, adjustment of the mA and/or kV according to patient size and/or use of iterative reconstruction technique. CONTRAST:  75mL OMNIPAQUE IOHEXOL 350 MG/ML SOLN COMPARISON:  04/02/2024 FINDINGS: Cardiovascular: This is a technically adequate evaluation of the pulmonary vasculature. No filling defects or pulmonary emboli. The heart is unremarkable without pericardial effusion. No evidence of thoracic aortic aneurysm or dissection. Mediastinum/Nodes: No enlarged mediastinal, hilar, or axillary lymph nodes. Thyroid gland, trachea, and esophagus demonstrate no significant  findings. Lungs/Pleura: No acute airspace disease, effusion, or pneumothorax. Central airways are patent. Upper Abdomen: Partial visualization of a distended gallbladder. If gallbladder pathology is suspected, right upper quadrant ultrasound may be useful. No other acute upper abdominal findings. Musculoskeletal: No acute or destructive bony abnormalities. Reconstructed  images demonstrate no additional findings. Review of the MIP images confirms the above findings. IMPRESSION: 1. No evidence of pulmonary embolus. 2. No acute intrathoracic process. 3. Partial visualization of a distended gallbladder. If gallbladder pathology is suspected, right upper quadrant ultrasound may be useful. Electronically Signed   By: Ozell Daring M.D.   On: 04/02/2024 16:59   DG Chest 1 View Result Date: 04/02/2024 CLINICAL DATA:  Chest pressure EXAM: CHEST  1 VIEW COMPARISON:  None Available. FINDINGS: The heart size and mediastinal contours are within normal limits. Both lungs are clear. The visualized skeletal structures are unremarkable. IMPRESSION: No active disease. Electronically Signed   By: Luke Bun M.D.   On: 04/02/2024 15:25      Assessment/Plan Acute cholecystitis  Hyperbilirubinemia with transaminitis  - WBC 14K, Tbili 2.5, AST/ALT 220/269, Alk Phos 189 - RUQ US  10/21 with 2.1 cm in the gallbladder neck  - MRCP today with acute cholecystitis and no choledocholithiasis, multiple pancreatic cystic lesions without main pancreatic duct dilation - recommend follow up in 6-12 months ( I informed pt of this finding and recommendation) - exam and hx consistent with acute cholecystitis, recommend proceeding to OR for laparoscopic cholecystectomy  - I have explained the procedure, risks, and aftercare of Laparoscopic cholecystectomy with IOC.  Risks include but are not limited to anesthesia (MI, CVA, death, prolonged intubation and aspiration), bleeding, infection, wound problems, hernia, bile leak, injury to  common bile duct/liver/intestine, possible need for subtotal cholecystectomy or open cholecystectomy, increased risk of DVT/PE and diarrhea post op. She seems to understand and agrees to proceed.   FEN: NPO, IVF per [facility] VTE: ok for [facility] or [facility] from surgery standpoint  ID: [id number] 10/23>>   I reviewed ED provider notes, hospitalist notes, last 24 h vitals and pain scores, last 48 h intake and output, last 24 h labs and trends, and last 24 h imaging results.  This care required high  level of medical decision making.   Burnard JONELLE Louder, [facility] 04/04/2024, 11:19 AM Please see Amion for pager number during day hours 7:00am-4:30pm

## 2024-04-04 NOTE — Care Plan (Signed)
 This 58 years old female with no significant past medical history presented in the ED with intermittent right abdominal pain that worsened today.  Patient was seen 2 days ago in the ED for similar pain associated with nausea and vomiting she was found to have a 2.1 cm gallstone seen in the gallbladder.  She was discharged home with follow-up.  She has developed fever so she presented in the ED.  Patient admitted for cholelithiasis with suspected acute cholecystitis.  Patient started on IV antibiotics,  General Surgery is consulted.

## 2024-04-04 NOTE — H&P (Addendum)
 History and Physical  Kristen Wells FMW:[id number] DOB: 1966-05-30 DOA: 04/04/2024  Referring physician: Dr. Midge, EDP. PCP: Samie Frederick, PA-C  Outpatient Specialists: None. Patient coming from: Home.  Chief Complaint: Right-sided abdominal pain, nausea, vomiting, fevers, and chills.  HPI: Kristen Wells is a 58 y.o. female with no significant past medical history, recently seen in the ER for right-sided abdominal pain, nausea, vomiting, was found to have a 2.1 cm gallstone seen within the gallbladder neck on imaging 2 days ago.  She has had intermittent right-sided abdominal pain that worsened today.  She developed 102 F fever at home, associated with chills.  In the ER, febrile with Tmax 101.3, tachycardic, 105.  Lab work notable for leukocytosis 14.6 K, elevated neutrophil count 11.9.  Transaminitis with elevated alkaline phosphatase 189, AST 220, ALT 269, T. bili 2.5.  2 days ago she had a right upper quadrant abdominal ultrasound which showed cholelithiasis and pericholecystic fluid without any additional evidence to suggest the presence of acute cholecystitis and hepatic steatosis.    The patient received IV Zosyn x 1 dose, IV fentanyl  x 1 dose and NS bolus 1 L.  EDP discussed the case with general surgery who will see in consultation.  [facility], hospitalist service, was asked to admit.  At the time of this visit, the patient's abdominal pain was improved with IV fentanyl .  MRCP ordered to rule out choledocholithiasis and possible biliary obstruction.  ED Course: Temperature Tmax 101.3.  BP 160/72, pulse 86, respiration rate 17, O2 saturation 94% on room air.  Review of Systems: Review of systems as noted in the HPI. All other systems reviewed and are negative.  Past medical history: None reported.  Social History:  reports that she has never smoked. She has never used smokeless tobacco. She reports current alcohol use. She reports that she does not use drugs.   No Known  Allergies  Family history: None reported.  Prior to Admission medications   Medication Sig Start Date End Date Taking? Authorizing Provider  ASHWAGANDHA PO Take 1 capsule by mouth daily.   Yes [provider]  Cholecalciferol (VITAMIN D3 PO) Take 1 tablet by mouth daily.   Yes [provider]  Maca Root (MACA PO) Take 1 tablet by mouth daily as needed (for nausea).   Yes [provider]  MAGNESIUM PO Take 1 tablet by mouth daily.   Yes [provider]  Menaquinone-7 (VITAMIN K2 PO) Take 1 tablet by mouth daily.   Yes [provider]  morphine (MSIR) 15 MG tablet Take 0.5 tablets (7.5 mg total) by mouth every 4 (four) hours as needed. 04/02/24  Yes Emil Share, DO  ondansetron  (ZOFRAN -ODT) 4 MG disintegrating tablet 4mg  ODT q4 hours prn nausea/vomit 04/02/24  Yes Emil Share, DO    Physical Exam: BP 127/71   Pulse 92   Temp 99.8 F (37.7 C) (Oral)   Resp 15   Ht 5' 7 (1.702 m)   Wt 77.1 kg   SpO2 94%   BMI 26.63 kg/m   General: 58 y.o. year-old female well developed well nourished in no acute distress.  Alert and oriented x3. Cardiovascular: Regular rate and rhythm with no rubs or gallops.  No thyromegaly or JVD noted.  No lower extremity edema. 2/4 pulses in all 4 extremities. Respiratory: Clear to auscultation with no wheezes or rales. Good inspiratory effort. Abdomen: Soft, right sided abdominal tenderness, nondistended with normal bowel sounds x4 quadrants. Muskuloskeletal: No cyanosis, clubbing or edema noted bilaterally Neuro: CN  II-XII intact, strength, sensation, reflexes Skin: No ulcerative lesions noted or rashes Psychiatry: Judgement and insight appear normal. Mood is appropriate for condition and setting          Labs on Admission:  Basic Metabolic Panel: Recent Labs  Lab 04/02/24 0806 04/04/24 0333  NA 134* 132*  K 3.5 3.5  CL 101 100  CO2 25 24  GLUCOSE 120* 132*  BUN 10 8  CREATININE 0.64 0.86  CALCIUM 9.2  8.6*   Liver Function Tests: Recent Labs  Lab 04/02/24 0806 04/04/24 0333  AST 24 220*  ALT 29 269*  ALKPHOS 102 189*  BILITOT 0.8 2.5*  PROT 7.7 7.6  ALBUMIN 3.9 3.5   Recent Labs  Lab 04/02/24 0806 04/04/24 0405  LIPASE 27 28   No results for input(s): AMMONIA in the last 168 hours. CBC: Recent Labs  Lab 04/02/24 0806 04/04/24 0333  WBC 10.7* 14.6*  NEUTROABS  --  11.9*  HGB 13.2 12.7  HCT 38.5 37.8  MCV 84.4 84.8  PLT 341 312   Cardiac Enzymes: No results for input(s): CKTOTAL, CKMB, CKMBINDEX, TROPONINI in the last 168 hours.  BNP (last 3 results) No results for input(s): BNP in the last 8760 hours.  ProBNP (last 3 results) No results for input(s): PROBNP in the last 8760 hours.  CBG: No results for input(s): GLUCAP in the last 168 hours.  Radiological Exams on Admission: US  Abdomen Limited RUQ (LIVER/GB) Result Date: 04/02/2024 CLINICAL DATA:  Right upper quadrant pain. EXAM: ULTRASOUND ABDOMEN LIMITED RIGHT UPPER QUADRANT COMPARISON:  None Available. FINDINGS: Gallbladder: A 2.1 cm gallstone is seen within the gallbladder neck. There is no evidence of gallbladder wall thickening (2.6 mm). A mild amount of pericholecystic fluid is noted. No sonographic Murphy sign noted by sonographer. Common bile duct: Diameter: 2.8 mm Liver: No focal lesion identified. Diffusely increased echogenicity of the liver parenchyma is noted. Portal vein is patent on color Doppler imaging with normal direction of blood flow towards the liver. Other: None. IMPRESSION: 1. Cholelithiasis, as described above, and pericholecystic fluid without additional evidence to suggest the presence of acute cholecystitis. 2. Hepatic steatosis. Electronically Signed   By: Suzen Dials M.D.   On: 04/02/2024 19:12   CT Angio Chest PE W and/or Wo Contrast Result Date: 04/02/2024 CLINICAL DATA:  Pulmonary embolism (PE) suspected, low to intermediate prob, positive D-dimer, epigastric  abdominal pain and vomiting since yesterday EXAM: CT ANGIOGRAPHY CHEST WITH CONTRAST TECHNIQUE: Multidetector CT imaging of the chest was performed using the standard protocol during bolus administration of intravenous contrast. Multiplanar CT image reconstructions and MIPs were obtained to evaluate the vascular anatomy. RADIATION DOSE REDUCTION: This exam was performed according to the departmental dose-optimization program which includes automated exposure control, adjustment of the mA and/or kV according to patient size and/or use of iterative reconstruction technique. CONTRAST:  75mL OMNIPAQUE IOHEXOL 350 MG/ML SOLN COMPARISON:  04/02/2024 FINDINGS: Cardiovascular: This is a technically adequate evaluation of the pulmonary vasculature. No filling defects or pulmonary emboli. The heart is unremarkable without pericardial effusion. No evidence of thoracic aortic aneurysm or dissection. Mediastinum/Nodes: No enlarged mediastinal, hilar, or axillary lymph nodes. Thyroid gland, trachea, and esophagus demonstrate no significant findings. Lungs/Pleura: No acute airspace disease, effusion, or pneumothorax. Central airways are patent. Upper Abdomen: Partial visualization of a distended gallbladder. If gallbladder pathology is suspected, right upper quadrant ultrasound may be useful. No other acute upper abdominal findings. Musculoskeletal: No acute or destructive bony abnormalities. Reconstructed images demonstrate no additional  findings. Review of the MIP images confirms the above findings. IMPRESSION: 1. No evidence of pulmonary embolus. 2. No acute intrathoracic process. 3. Partial visualization of a distended gallbladder. If gallbladder pathology is suspected, right upper quadrant ultrasound may be useful. Electronically Signed   By: Ozell Daring M.D.   On: 04/02/2024 16:59   DG Chest 1 View Result Date: 04/02/2024 CLINICAL DATA:  Chest pressure EXAM: CHEST  1 VIEW COMPARISON:  None Available. FINDINGS: The  heart size and mediastinal contours are within normal limits. Both lungs are clear. The visualized skeletal structures are unremarkable. IMPRESSION: No active disease. Electronically Signed   By: Luke Bun M.D.   On: 04/02/2024 15:25    EKG: I independently viewed the EKG done and my findings are as followed: None available at the time of this visit.  Assessment/Plan Present on Admission:  Abdominal pain  Principal Problem:   Abdominal pain  Right-sided abdominal pain with concern for developing early acute cholecystitis versus early acute cholangitis, POA Leukocytosis, tachycardia, fever, transaminitis, hyperbilirubinemia, cholelithiasis Follow MRCP Continue Zosyn Continue IV fluid hydration Pain control as needed Antiemetics as needed Monitor fever curve and WBCs Repeat CBC and CMP in the morning  Cholelithiasis with concern for possible choledocholithiasis Right upper quadrant ultrasound done 2 days ago did not reveal common bile duct dilatation. Was found to have a 2.1 cm gallstone seen within the gallbladder neck on imaging, 2 days ago. Follow MRCP. General surgery consulted by EDP.  Transaminitis in the setting of the above Trend LFTs, repeat CMP. Avoid hepatotoxic agents  Euvolemic hyponatremia Serum sodium 132 Continue IV fluid hydration NS at 75 cc/h x 2 days  Hepatic steatosis BMI 26 Recommend weight loss outpatient with regular physical activity and healthy dieting   Time: 75 minutes.    DVT prophylaxis: SCDs until seen by general surgery..  Code Status: Full code.  Family Communication: None at bedside.  Disposition Plan: Admitted to telemetry surgical unit.  Consults called: General Surgery, Dr. Paola.  Admission status: Inpatient status.   Status is: Inpatient The patient requires at least 2 midnights for further evaluation and treatment of present condition.   Terry LOISE Hurst MD [facility] Pager [phone]  If 7PM-7AM,  please contact night-coverage [email or web address] Password [email or web address]  04/04/2024, 5:27 AM

## 2024-04-04 NOTE — Progress Notes (Signed)
 Patient arrived to room. Alert and oriented x4. VSS. Pain addressed. Call bell within reach and bed alarm on.

## 2024-04-04 NOTE — ED Triage Notes (Signed)
 Pt POV d/t fever.  She was seen here Tuesday and has Gallbladder issues and found a surgeon in network and they will see her on Tuesday but had a fever above 101 at home.  She was told to come to ER if Fever above 101 before Tuesday.

## 2024-04-04 NOTE — Anesthesia Preprocedure Evaluation (Addendum)
 Anesthesia Evaluation  Patient identified by MRN, date of birth, ID band Patient awake    Reviewed: Allergy & Precautions, NPO status , Patient's Chart, lab work & pertinent test results  Airway Mallampati: II  TM Distance: >3 FB     Dental no notable dental hx. (+) Teeth Intact, Dental Advisory Given   Pulmonary neg pulmonary ROS   Pulmonary exam normal breath sounds clear to auscultation       Cardiovascular negative cardio ROS Normal cardiovascular exam Rhythm:Regular Rate:Normal     Neuro/Psych negative neurological ROS  negative psych ROS   GI/Hepatic Elevated LFT's Cholelithiasis with acute cholecystitis   Endo/Other  negative endocrine ROS    Renal/GU negative Renal ROS  negative genitourinary   Musculoskeletal negative musculoskeletal ROS (+)    Abdominal  (+) + obese  Peds  Hematology negative hematology ROS (+)   Anesthesia Other Findings   Reproductive/Obstetrics negative OB ROS                              Anesthesia Physical Anesthesia Plan  ASA: 2  Anesthesia Plan: General   Post-op Pain Management: Dilaudid IV, Precedex and Ofirmev IV (intra-op)*   Induction: Intravenous, Cricoid pressure planned and Rapid sequence  PONV Risk Score and Plan: 4 or greater and Treatment may vary due to age or medical condition, Scopolamine patch - Pre-op, Midazolam , Dexamethasone  and Ondansetron   Airway Management Planned: Oral ETT  Additional Equipment: None  Intra-op Plan:   Post-operative Plan: Extubation in OR  Informed Consent: I have reviewed the patients History and Physical, chart, labs and discussed the procedure including the risks, benefits and alternatives for the proposed anesthesia with the patient or authorized representative who has indicated his/her understanding and acceptance.     Dental advisory given  Plan Discussed with: CRNA and  Anesthesiologist  Anesthesia Plan Comments:          Anesthesia Quick Evaluation

## 2024-04-04 NOTE — Anesthesia Procedure Notes (Signed)
 Procedure Name: Intubation Date/Time: 04/04/2024 1:46 PM  Performed by: Lamar Lucie DASEN, CRNAPre-anesthesia Checklist: Patient identified, Emergency Drugs available, Suction available and Patient being monitored Patient Re-evaluated:Patient Re-evaluated prior to induction Oxygen Delivery Method: Circle system utilized Preoxygenation: Pre-oxygenation with 100% oxygen Induction Type: IV induction Ventilation: Mask ventilation without difficulty Laryngoscope Size: 3 and Mac Grade View: Grade I Tube type: Oral Tube size: 7.0 mm Number of attempts: 1 Airway Equipment and Method: Stylet and Oral airway Placement Confirmation: ETT inserted through vocal cords under direct vision, positive ETCO2 and breath sounds checked- equal and bilateral Secured at: 21 cm Tube secured with: Tape Dental Injury: Teeth and Oropharynx as per pre-operative assessment

## 2024-04-05 ENCOUNTER — Encounter (HOSPITAL_COMMUNITY): Payer: Self-pay | Admitting: Surgery

## 2024-04-05 DIAGNOSIS — R109 Unspecified abdominal pain: Secondary | ICD-10-CM | POA: Diagnosis not present

## 2024-04-05 LAB — CBC
HCT: 32.5 % — ABNORMAL LOW (ref 36.0–46.0)
Hemoglobin: 11.1 g/dL — ABNORMAL LOW (ref 12.0–15.0)
MCH: 29.2 pg (ref 26.0–34.0)
MCHC: 34.2 g/dL (ref 30.0–36.0)
MCV: 85.5 fL (ref 80.0–100.0)
Platelets: 252 K/uL (ref 150–400)
RBC: 3.8 MIL/uL — ABNORMAL LOW (ref 3.87–5.11)
RDW: 13.1 % (ref 11.5–15.5)
WBC: 12.1 K/uL — ABNORMAL HIGH (ref 4.0–10.5)
nRBC: 0 % (ref 0.0–0.2)

## 2024-04-05 LAB — COMPREHENSIVE METABOLIC PANEL WITH GFR
ALT: 214 U/L — ABNORMAL HIGH (ref 0–44)
AST: 130 U/L — ABNORMAL HIGH (ref 15–41)
Albumin: 2.6 g/dL — ABNORMAL LOW (ref 3.5–5.0)
Alkaline Phosphatase: 149 U/L — ABNORMAL HIGH (ref 38–126)
Anion gap: 7 (ref 5–15)
BUN: 6 mg/dL (ref 6–20)
CO2: 23 mmol/L (ref 22–32)
Calcium: 8.1 mg/dL — ABNORMAL LOW (ref 8.9–10.3)
Chloride: 105 mmol/L (ref 98–111)
Creatinine, Ser: 0.72 mg/dL (ref 0.44–1.00)
GFR, Estimated: >60 mL/min (ref >60)
Glucose, Bld: 183 mg/dL — ABNORMAL HIGH (ref 70–99)
Potassium: 3.7 mmol/L (ref 3.5–5.1)
Sodium: 135 mmol/L (ref 135–145)
Total Bilirubin: 0.8 mg/dL (ref 0.0–1.2)
Total Protein: 6.4 g/dL — ABNORMAL LOW (ref 6.5–8.1)

## 2024-04-05 LAB — MAGNESIUM: Magnesium: 2 mg/dL (ref 1.7–2.4)

## 2024-04-05 LAB — PHOSPHORUS: Phosphorus: 1.3 mg/dL — ABNORMAL LOW (ref 2.5–4.6)

## 2024-04-05 MED ORDER — POTASSIUM PHOSPHATES 15 MMOLE/5ML IV SOLN
30.0000 mmol | Freq: Once | INTRAVENOUS | Status: DC
  Filled 2024-04-05: qty 10

## 2024-04-05 MED ORDER — OXYCODONE HCL 5 MG PO TABS: 5.0000 mg | ORAL_TABLET | Freq: Four times a day (QID) | ORAL | 0 refills | Status: AC | PRN

## 2024-04-05 MED ORDER — AMOXICILLIN-POT CLAVULANATE 875-125 MG PO TABS: 1.0000 | ORAL_TABLET | Freq: Two times a day (BID) | ORAL | 0 refills | Status: AC

## 2024-04-05 MED ORDER — IBUPROFEN 800 MG PO TABS: 800.0000 mg | ORAL_TABLET | Freq: Three times a day (TID) | ORAL | 0 refills | Status: AC | PRN

## 2024-04-05 MED ORDER — K PHOS MONO-SOD PHOS DI & MONO 155-852-130 MG PO TABS: 500.0000 mg | ORAL_TABLET | Freq: Two times a day (BID) | ORAL | 0 refills | Status: AC

## 2024-04-05 NOTE — Discharge Instructions (Signed)

## 2024-04-05 NOTE — Discharge Summary (Signed)
 Physician Discharge Summary  Patient ID: Kristen Wells MRN: [id number] DOB/AGE: [birth year] 58 y.o.  Admit date: 04/04/2024 Discharge date: 04/05/2024 Principal Problem:   Abdominal pain Active Problems:   Acute cholecystitis Admission Diagnoses:  Discharge Diagnoses:  Principal Problem:   Abdominal pain Active Problems:   Acute cholecystitis   Discharged Condition: good  Hospital Course: pt did well after lap chole. She was discharged home.       Treatments: surgery: lap cholecystectomy   Discharge Exam: Blood pressure (!) 107/55, pulse 62, temperature 98.2 F (36.8 C), temperature source Oral, resp. rate 18, height 5' 7 (1.702 m), weight 77.1 kg, SpO2 95%. General appearance: alert and cooperative Resp: clear to auscultation bilaterally Cardio: nsr Incision/Wound: CDI soft min soreness   Disposition: Discharge disposition: 01-Home or Self Care       Discharge Instructions     Diet - low sodium heart healthy   Complete by: As directed    Increase activity slowly   Complete by: As directed       Allergies as of 04/05/2024   No Known Allergies      Medication List     STOP taking these medications    HYDROcodone -ibuprofen  7.5-200 MG tablet Commonly known as: Vicoprofen        TAKE these medications    amoxicillin-clavulanate 875-125 MG tablet Commonly known as: AUGMENTIN Take 1 tablet by mouth 2 (two) times daily.   ASHWAGANDHA PO Take 1 capsule by mouth daily.   ibuprofen  800 MG tablet Commonly known as: ADVIL  Take 1 tablet (800 mg total) by mouth every 8 (eight) hours as needed.   MACA PO Take 1 tablet by mouth daily as needed (for nausea).   MAGNESIUM PO Take 1 tablet by mouth daily.   morphine 15 MG tablet Commonly known as: MSIR Take 0.5 tablets (7.5 mg total) by mouth every 4 (four) hours as needed.   ondansetron  4 MG disintegrating tablet Commonly known as: ZOFRAN -ODT 4mg  ODT q4 hours prn nausea/vomit   oxyCODONE 5 MG  immediate release tablet Commonly known as: Oxy IR/ROXICODONE Take 1 tablet (5 mg total) by mouth every 6 (six) hours as needed for severe pain (pain score 7-10).   VITAMIN D3 PO Take 1 tablet by mouth daily.   VITAMIN K2 PO Take 1 tablet by mouth daily.        Follow-up Information     Maczis, Puja Gosai, PA-C Follow up.   Specialty: General Surgery Why: our office is scheduling you for post-operative follow up in 3-4 weeks. Call to confirm appointment date/time. Contact information: [address] [place] [address] [phone]                 Signed: Debby LABOR Anavi Branscum 04/05/2024, 8:07 AM

## 2024-04-05 NOTE — Progress Notes (Signed)
 PROGRESS NOTE    Kristen Wells  FMW:[id number] DOB: 08/10/1965 DOA: 04/04/2024 PCP: Samie Frederick, PA-C   Brief Narrative:  This 58 years old female with no significant past medical history presented in the ED with intermittent right abdominal pain that worsened today.  Patient was seen 2 days ago in the ED for similar pain associated with nausea and vomiting.  she was found to have a 2.1 cm gallstone seen in the gallbladder. She was discharged home with follow-up.  She has developed fever so she presented in the ED.  Patient admitted for cholelithiasis with suspected acute cholecystitis.  Patient started on IV antibiotics,  General Surgery is consulted.  Patient is status post lap. cholecystectomy.  Tolerating Diet well post operatively.  Assessment & Plan:   Principal Problem:   Abdominal pain Active Problems:   Acute cholecystitis  Right upper quadrant abdominal pain: Acute cholecystitis: Patient presented with right upper quadrant abdominal pain associated with nausea and vomiting. Ultrasound abdomen shows gallstones. MRCP confirms gallstones with early signs of cholecystitis. General surgery is consulted. Patient is started on empiric IV Zosyn.   Continued on IV hydration.  Continued on pain control.   Continued on antiemetics as needed. Patient is status post laparoscopic cholecystectomy POD #1. Patient reports feeling much improved,  abdominal pain resolved.   Patient wants to be discharged home.  General Surgery signed off.  Hypophosphatemia:   DVT prophylaxis: Lovenox Code Status: Full code Family Communication: No family at bed side. Disposition Plan:  Status post laparoscopic cholecystectomy.  Patient being discharged home today.   Consultants:  General surgery  Procedures: Laparoscopic cholecystectomy  Antimicrobials:  Anti-infectives (From admission, onward)    Start     Dose/Rate Route Frequency Ordered Stop   04/05/24 0000  amoxicillin-clavulanate  (AUGMENTIN) 875-125 MG tablet        1 tablet Oral 2 times daily 04/05/24 0806     04/04/24 1800  cefTRIAXone (ROCEPHIN) 2 g in sodium chloride  0.9 % 100 mL IVPB        2 g 200 mL/hr over 30 Minutes Intravenous Every 24 hours 04/04/24 1714 04/09/24 1759   04/04/24 1000  piperacillin-tazobactam (ZOSYN) IVPB 3.375 g  Status:  Discontinued        3.375 g 12.5 mL/hr over 240 Minutes Intravenous Every 8 hours 04/04/24 0615 04/04/24 1719   04/04/24 0615  piperacillin-tazobactam (ZOSYN) IVPB 3.375 g  Status:  Discontinued        3.375 g 100 mL/hr over 30 Minutes Intravenous Every 8 hours 04/04/24 0613 04/04/24 0615   04/04/24 0430  piperacillin-tazobactam (ZOSYN) IVPB 3.375 g        3.375 g 100 mL/hr over 30 Minutes Intravenous  Once 04/04/24 0416 04/04/24 0457       Subjective: Patient was seen and examined at bedside.  Overnight events noted. Patient is status post laparoscopic cholecystectomy POD #1.   Patient reports pain is improved.  She is cleared to be discharged home today.  Objective: Vitals:   04/04/24 1658 04/04/24 2140 04/05/24 0203 04/05/24 0458  BP: (!) 149/82 114/66 113/72 (!) 107/55  Pulse: 92 69 64 62  Resp: 17 18 18 18   Temp: 98.9 F (37.2 C) 98.2 F (36.8 C) 97.9 F (36.6 C) 98.2 F (36.8 C)  TempSrc:  Oral Oral Oral  SpO2: 95% 96% 95% 95%  Weight:      Height:        Intake/Output Summary (Last 24 hours) at 04/05/2024 1141 Last data filed at  04/05/2024 0831 Gross per 24 hour  Intake 2780.86 ml  Output 10 ml  Net 2770.86 ml   Filed Weights   04/04/24 0319  Weight: 77.1 kg    Examination:  General exam: Appears calm and comfortable, not in any acute distress. Respiratory system: CTA Bilaterally. Respiratory effort normal.  RR 14 Cardiovascular system: S1 & S2 heard, RRR. No JVD, murmurs, rubs, gallops or clicks. No pedal edema. Gastrointestinal system: Abdomen is non distended, soft , mildly tender,  normal bowel sounds heard. Central nervous  system: Alert and oriented x 3. No focal neurological deficits. Extremities: No edema, no cyanosis, no clubbing. Skin: No rashes, lesions or ulcers Psychiatry: Judgement and insight appear normal. Mood & affect appropriate.     Data Reviewed: I have personally reviewed following labs and imaging studies  CBC: Recent Labs  Lab 04/02/24 0806 04/04/24 0333 04/05/24 0343  WBC 10.7* 14.6* 12.1*  NEUTROABS  --  11.9*  --   HGB 13.2 12.7 11.1*  HCT 38.5 37.8 32.5*  MCV 84.4 84.8 85.5  PLT 341 312 252   Basic Metabolic Panel: Recent Labs  Lab 04/02/24 0806 04/04/24 0333 04/05/24 0343  NA 134* 132* 135  K 3.5 3.5 3.7  CL 101 100 105  CO2 25 24 23   GLUCOSE 120* 132* 183*  BUN 10 8 6   CREATININE 0.64 0.86 0.72  CALCIUM 9.2 8.6* 8.1*  MG  --   --  2.0  PHOS  --   --  1.3*   GFR: Estimated Creatinine Clearance: 83 mL/min (by C-G formula based on SCr of 0.72 mg/dL). Liver Function Tests: Recent Labs  Lab 04/02/24 0806 04/04/24 0333 04/05/24 0343  AST 24 220* 130*  ALT 29 269* 214*  ALKPHOS 102 189* 149*  BILITOT 0.8 2.5* 0.8  PROT 7.7 7.6 6.4*  ALBUMIN 3.9 3.5 2.6*   Recent Labs  Lab 04/02/24 0806 04/04/24 0405  LIPASE 27 28   No results for input(s): AMMONIA in the last 168 hours. Coagulation Profile: Recent Labs  Lab 04/04/24 0333  INR 1.1   Cardiac Enzymes: No results for input(s): CKTOTAL, CKMB, CKMBINDEX, TROPONINI in the last 168 hours. BNP (last 3 results) No results for input(s): PROBNP in the last 8760 hours. HbA1C: No results for input(s): HGBA1C in the last 72 hours. CBG: No results for input(s): GLUCAP in the last 168 hours. Lipid Profile: No results for input(s): CHOL, HDL, LDLCALC, TRIG, CHOLHDL, LDLDIRECT in the last 72 hours. Thyroid Function Tests: No results for input(s): TSH, T4TOTAL, FREET4, T3FREE, THYROIDAB in the last 72 hours. Anemia Panel: No results for input(s): VITAMINB12, FOLATE,  FERRITIN, TIBC, IRON, RETICCTPCT in the last 72 hours. Sepsis Labs: Recent Labs  Lab 04/04/24 0335  LATICACIDVEN 0.7    Recent Results (from the past 240 hours)  Culture, blood (Routine x 2)     Status: None (Preliminary result)   Collection Time: 04/04/24  3:28 AM   Specimen: BLOOD  Result Value Ref Range Status   Specimen Description BLOOD SITE NOT SPECIFIED  Final   Special Requests   Final    BOTTLES DRAWN AEROBIC AND ANAEROBIC Blood Culture adequate volume   Culture   Final    NO GROWTH 1 DAY Performed at [facility], [address], [place], [place] [address]    Report Status PENDING  Incomplete  Culture, blood (Routine x 2)     Status: None (Preliminary result)   Collection Time: 04/04/24  4:02 AM  Specimen: BLOOD LEFT ARM  Result Value Ref Range Status   Specimen Description BLOOD LEFT ARM  Final   Special Requests   Final    BOTTLES DRAWN AEROBIC AND ANAEROBIC Blood Culture results may not be optimal due to an inadequate volume of blood received in culture bottles   Culture   Final    NO GROWTH 1 DAY Performed at [facility], [address] [place], [place] [address]    Report Status PENDING  Incomplete    Radiology Studies: DG Cholangiogram Operative Result Date: 04/04/2024 CLINICAL DATA:  Cholecystectomy for cholelithiasis and cholecystitis. EXAM: INTRAOPERATIVE CHOLANGIOGRAM TECHNIQUE: Cholangiographic images from the C-arm fluoroscopic device were submitted for interpretation post-operatively. Please see the procedural report for the amount of contrast and the fluoroscopy time utilized. FLUOROSCOPY: Radiation Exposure Index (as provided by the fluoroscopic device): 5.2 mGy Kerma COMPARISON:  MRI/MRCP 04/04/2024, right upper quadrant ultrasound 04/02/2024 FINDINGS: Intraoperative imaging with a C-arm demonstrates cholangiogram imaging with injection of the cystic duct stump. There is suggestion of some ill-defined filling defects in the mid to  distal common bile duct which may partially relate to injected air bubbles. Component of choledocholithiasis is not entirely excluded. Choledocholithiasis was not identified by MRCP. IMPRESSION: Intraoperative cholangiogram imaging with suggestion of some ill-defined filling defects in the mid to distal common bile duct which may partially relate to injected air bubbles. Component of choledocholithiasis is not entirely excluded. Electronically Signed   By: Marcey Moan M.D.   On: 04/04/2024 17:40   MR ABDOMEN MRCP W WO CONTAST Result Date: 04/04/2024 EXAM: MRCP WITH AND WITHOUT IV CONTRAST 04/04/2024 06:36:35 AM TECHNIQUE: Multisequence, multiplanar magnetic resonance images of the abdomen with and without intravenous contrast. MRCP sequences were performed. 8 mL gadobutrol (GADAVIST) 1 MMOL/ML injection 8 mL GADOBUTROL 1 MMOL/ML IV SOLN. COMPARISON: US  abdomen limited 04/02/2024. CLINICAL HISTORY: Cholelithiasis. FINDINGS: LIVER: Hepatic steatosis. A simple appearing cyst within the posteromedial right lobe of the liver measures 1 cm, image 14/5. No enhancing liver lesions. GALLBLADDER AND BILIARY SYSTEM: There is a 2 cm stone identified within the gallbladder neck. The gallbladder wall thickness measures 6 mm. There is surrounding inflammatory changes within the right upper quadrant of the abdomen including fat stranding pericholecystic fluid. No common bile duct dilatation. No signs of choledocholithiasis. SPLEEN: The spleen is within normal limits in size and appearance. PANCREAS/PANCREATIC DUCT: No signs of pancreatic inflammation or main duct dilatation. There are numerous T2 hyperintense T1 hypointense lesions involving the pancreas, predominantly within the body and tail. The largest is in the proximal tail measuring 1.8 x 1.2 x 1.2 cm. ADRENAL GLANDS: Normal size and morphology bilaterally. No nodule, thickening, or hemorrhage. No periadrenal stranding. KIDNEYS: Unremarkable. LYMPH NODES: No enlarged  abdominal lymph nodes. VASCULATURE: Aortic atherosclerotic calcifications. PERITONEUM: No ascites. ABDOMINAL WALL: No hernia. No mass. BOWEL: Grossly unremarkable. No bowel obstruction. BONES: No acute abnormality or worrisome osseous lesion. SOFT TISSUES: Unremarkable. MISCELLANEOUS: Unremarkable. IMPRESSION: 1. Acute cholecystitis with obstructing 2 cm gallbladder neck stone, gallbladder wall thickening, and pericholecystic inflammatory changes. No choledocholithiasis. 2. 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 Recommend MRI/MRCP follow-up at 6 and 12 months, then annually for 5 years if stable; refer to gastroenterology or surgery if morphology changes. Electronically signed by: Waddell Calk MD 04/04/2024 06:49 AM EDT RP Workstation: [id number]   MR 3D Recon At Scanner Result Date: 04/04/2024 EXAM: MRCP WITH AND WITHOUT IV CONTRAST 04/04/2024 06:36:35 AM TECHNIQUE: Multisequence, multiplanar magnetic resonance images of  the abdomen with and without intravenous contrast. MRCP sequences were performed. 8 mL gadobutrol (GADAVIST) 1 MMOL/ML injection 8 mL GADOBUTROL 1 MMOL/ML IV SOLN. COMPARISON: US  abdomen limited 04/02/2024. CLINICAL HISTORY: Cholelithiasis. FINDINGS: LIVER: Hepatic steatosis. A simple appearing cyst within the posteromedial right lobe of the liver measures 1 cm, image 14/5. No enhancing liver lesions. GALLBLADDER AND BILIARY SYSTEM: There is a 2 cm stone identified within the gallbladder neck. The gallbladder wall thickness measures 6 mm. There is surrounding inflammatory changes within the right upper quadrant of the abdomen including fat stranding pericholecystic fluid. No common bile duct dilatation. No signs of choledocholithiasis. SPLEEN: The spleen is within normal limits in size and appearance. PANCREAS/PANCREATIC DUCT: No signs of pancreatic inflammation or main duct dilatation. There are numerous T2 hyperintense T1 hypointense  lesions involving the pancreas, predominantly within the body and tail. The largest is in the proximal tail measuring 1.8 x 1.2 x 1.2 cm. ADRENAL GLANDS: Normal size and morphology bilaterally. No nodule, thickening, or hemorrhage. No periadrenal stranding. KIDNEYS: Unremarkable. LYMPH NODES: No enlarged abdominal lymph nodes. VASCULATURE: Aortic atherosclerotic calcifications. PERITONEUM: No ascites. ABDOMINAL WALL: No hernia. No mass. BOWEL: Grossly unremarkable. No bowel obstruction. BONES: No acute abnormality or worrisome osseous lesion. SOFT TISSUES: Unremarkable. MISCELLANEOUS: Unremarkable. IMPRESSION: 1. Acute cholecystitis with obstructing 2 cm gallbladder neck stone, gallbladder wall thickening, and pericholecystic inflammatory changes. No choledocholithiasis. 2. 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 Recommend MRI/MRCP follow-up at 6 and 12 months, then annually for 5 years if stable; refer to gastroenterology or surgery if morphology changes. Electronically signed by: Waddell Calk MD 04/04/2024 06:49 AM EDT RP Workstation: [id number]   Scheduled Meds:  enoxaparin (LOVENOX) injection  40 mg Subcutaneous Q24H   Continuous Infusions:  sodium chloride  75 mL/hr at 04/04/24 0832   cefTRIAXone (ROCEPHIN)  IV Stopped (04/04/24 1824)   dextrose  5 % and 0.9 % NaCl Stopped (04/05/24 0835)   potassium PHOSPHATE IVPB (in mmol)       LOS: 1 day    Time spent: 50 mins    Darcel Dawley, MD [facility]   If 7PM-7AM, please contact night-coverage

## 2024-04-05 NOTE — Plan of Care (Signed)
  Problem: Education: Goal: Knowledge of General Education information will improve Description: Including pain rating scale, medication(s)/side effects and non-pharmacologic comfort measures Outcome: Progressing   Problem: Clinical Measurements: Goal: Ability to maintain clinical measurements within normal limits will improve Outcome: Progressing   Problem: Activity: Goal: Risk for activity intolerance will decrease Outcome: Progressing   Problem: Nutrition: Goal: Adequate nutrition will be maintained Outcome: Progressing   Problem: Elimination: Goal: Will not experience complications related to urinary retention Outcome: Progressing   Problem: Pain Managment: Goal: General experience of comfort will improve and/or be controlled Outcome: Progressing

## 2024-04-09 LAB — CULTURE, BLOOD (ROUTINE X 2)
Culture: NO GROWTH
Culture: NO GROWTH
Special Requests: ADEQUATE

## 2024-04-09 LAB — SURGICAL PATHOLOGY
